# Patient Record
Sex: Female | Born: 2000 | Race: Black or African American | Hispanic: No | Marital: Single | Smoking: Never smoker
Health system: Southern US, Community
[De-identification: ages and names within clinical notes are randomized; demographics above are authoritative.]

## PROBLEM LIST (undated history)

## (undated) DIAGNOSIS — D573 Sickle-cell trait: Secondary | ICD-10-CM

## (undated) HISTORY — PX: WISDOM TOOTH EXTRACTION: SHX21

---
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
## 2019-01-24 ENCOUNTER — Other Ambulatory Visit: Payer: Self-pay

## 2019-01-24 ENCOUNTER — Ambulatory Visit (INDEPENDENT_AMBULATORY_CARE_PROVIDER_SITE_OTHER): Payer: Medicaid Other | Admitting: Certified Nurse Midwife

## 2019-01-24 ENCOUNTER — Encounter: Payer: Self-pay | Admitting: Certified Nurse Midwife

## 2019-01-24 ENCOUNTER — Other Ambulatory Visit (HOSPITAL_COMMUNITY)
Admission: RE | Admit: 2019-01-24 | Discharge: 2019-01-24 | Disposition: A | Discharge status: Home / Self Care | Payer: Medicaid Other | Source: Ambulatory Visit | Attending: Certified Nurse Midwife | Admitting: Certified Nurse Midwife

## 2019-01-24 VITALS — BP 118/72 | HR 102 | Wt 132.6 lb

## 2019-01-24 DIAGNOSIS — Z975 Presence of (intrauterine) contraceptive device: Secondary | ICD-10-CM | POA: Diagnosis not present

## 2019-01-24 DIAGNOSIS — N898 Other specified noninflammatory disorders of vagina: Secondary | ICD-10-CM | POA: Insufficient documentation

## 2019-01-24 NOTE — Patient Instructions (Signed)
Alternative Vaginitis Therapies  1) soak in tub of warm water waist high with 1/2 cup of baking soda in water for ~ 20 mins. 2) soak 3 tampons in 1 tablespoon of fractionated (liquid form) coconut oil with 10 drops of Melaleuca (Tea Tree) essential oil, insert 1 saturated tampon vaginally at bedtime x 3 days. Both options are to be done after sexual intercourse, menses and when suspects BV and/or yeast infection. Advised that these alternatives will not replace the need to be evaluated, if sx's persist. You will need to seek care at an OB/GYN provider.  GO WHITE: Soap: UNSCENTED Dove (white box light green writing) Laundry detergent (underwear)- Dreft or [employer] unscented WHITE 100% cotton panties (NOT just cotton crouch) Sanitary napkin/panty liners: UNSCENTED.  If it doesn't SAY unscented it can have a scent/perfume    NO PERFUMES OR LOTIONS OR POTIONS in the vulvar area (may use regular [place]) Condoms: hypoallergenic only. Non dyed (no color) Toilet papers: white only Wash clothes: use a separate wash cloth. WHITE.  Wash in Dreft.   You can purchase Tea Tree Oil locally at:  [employer] [address] [place], [place] [address] [phone]  [employer] [address] [place], [place] [address] [phone]  [employer] [address] [place], [place] [address] [phone] 

## 2019-01-24 NOTE — Progress Notes (Signed)
Pt reports vaginal discharge for about a week, pt also reports vaginal odor and itching.

## 2019-01-24 NOTE — Progress Notes (Signed)
History:  [name] is a 18 y.o. G0P0000 who presents to clinic today for vaginal discharge. She reports vaginal discharge started occurring one week ago, describes the discharge as clear thin discharge with odor. She reports occasional itching and irritation. Has unprotected IC with 1 partner, denies changes in partner since last seen. Currently has an IUD in place for birth control.    The following portions of the patient's history were reviewed and updated as appropriate: allergies, current medications, family history, past medical history, social history, past surgical history and problem list.  Review of Systems:  Review of Systems  Constitutional: Negative.   Respiratory: Negative.   Cardiovascular: Negative.   Gastrointestinal: Negative.   Genitourinary:       Vaginal discharge and odor   Neurological: Negative.      Objective:  Physical Exam BP 118/72   Pulse (!) 102   Wt 132 lb 9.6 oz (60.1 kg)   LMP 12/22/2018 (Approximate)   BMI 22.76 kg/m  Physical Exam Vitals signs and nursing note reviewed.  HENT:     Head: Normocephalic.  Cardiovascular:     Rate and Rhythm: Normal rate and regular rhythm.  Pulmonary:     Effort: Pulmonary effort is normal. No respiratory distress.     Breath sounds: Normal breath sounds.  Abdominal:     General: There is no distension.     Palpations: Abdomen is soft.     Tenderness: There is no abdominal tenderness.  Genitourinary:    Comments: Vaginal swabs obtained IUD strings palpated with bimanual examination  Bimanual exam: Cervix 0/long/high, firm, anterior, neg CMT, uterus nontender, nonenlarged, adnexa without tenderness, enlargement, or mass. IUD in place Skin:    General: Skin is warm and dry.  Neurological:     Mental Status: She is alert and oriented to person, place, and time.  Psychiatric:        Mood and Affect: Mood normal.        Behavior: Behavior normal.        Thought Content: Thought content  normal.     Assessment & Plan:  1. Vaginal discharge - Patient reports using friend's Dr Nancee Liter peppermint soap last week, since has noticed discharge and odor - Blind vaginal swabs obtained  - Educated and discussed vaginitis and use of unscented soap, laundry detergent, wash cloths and underwear use with not wearing thongs daily- patient verbalizes understanding  - Cervicovaginal ancillary only( [place])  2. IUD (intrauterine device) in place - Patient was evaluated in April 2020 for abnormal bleeding with IUD, started on OCP for 3 months to normalize bleeding  - Patient denies problems or concerns with IUD- is happy with IUD and denies continued abnormal bleeding at this time    Will follow up for results of cervicovaginal ancillary swab and manage accordingly   Lajean Manes, CNM 01/24/2019 10:25 AM

## 2019-01-27 ENCOUNTER — Emergency Department (HOSPITAL_COMMUNITY)
Admission: EM | Admit: 2019-01-27 | Discharge: 2019-01-27 | Disposition: A | Discharge status: Home / Self Care | Payer: Medicaid Other | Attending: Emergency Medicine | Admitting: Emergency Medicine

## 2019-01-27 ENCOUNTER — Encounter (HOSPITAL_COMMUNITY): Payer: Self-pay

## 2019-01-27 ENCOUNTER — Other Ambulatory Visit: Payer: Self-pay

## 2019-01-27 ENCOUNTER — Emergency Department (HOSPITAL_COMMUNITY): Payer: Medicaid Other

## 2019-01-27 DIAGNOSIS — R1110 Vomiting, unspecified: Secondary | ICD-10-CM | POA: Diagnosis not present

## 2019-01-27 DIAGNOSIS — R1011 Right upper quadrant pain: Secondary | ICD-10-CM | POA: Diagnosis not present

## 2019-01-27 LAB — URINALYSIS, ROUTINE W REFLEX MICROSCOPIC
Bacteria, UA: NONE SEEN
Bilirubin Urine: NEGATIVE
Glucose, UA: NEGATIVE mg/dL
Ketones, ur: 5 mg/dL — AB
Nitrite: NEGATIVE
Protein, ur: NEGATIVE mg/dL
Specific Gravity, Urine: >1.046 — ABNORMAL HIGH (ref 1.005–1.030)
pH: 6 (ref 5.0–8.0)

## 2019-01-27 LAB — CBC
HCT: 39 % (ref 36.0–46.0)
Hemoglobin: 13.5 g/dL (ref 12.0–15.0)
MCH: 30.4 pg (ref 26.0–34.0)
MCHC: 34.6 g/dL (ref 30.0–36.0)
MCV: 87.8 fL (ref 80.0–100.0)
Platelets: 210 10*3/uL (ref 150–400)
RBC: 4.44 MIL/uL (ref 3.87–5.11)
RDW: 12 % (ref 11.5–15.5)
WBC: 10.6 10*3/uL — ABNORMAL HIGH (ref 4.0–10.5)
nRBC: 0 % (ref 0.0–0.2)

## 2019-01-27 LAB — COMPREHENSIVE METABOLIC PANEL
ALT: 10 U/L (ref 0–44)
AST: 15 U/L (ref 15–41)
Albumin: 4.8 g/dL (ref 3.5–5.0)
Alkaline Phosphatase: 66 U/L (ref 38–126)
Anion gap: 9 (ref 5–15)
BUN: 14 mg/dL (ref 6–20)
CO2: 27 mmol/L (ref 22–32)
Calcium: 9.9 mg/dL (ref 8.9–10.3)
Chloride: 102 mmol/L (ref 98–111)
Creatinine, Ser: 0.99 mg/dL (ref 0.44–1.00)
GFR calc Af Amer: >60 mL/min (ref >60)
GFR calc non Af Amer: >60 mL/min (ref >60)
Glucose, Bld: 116 mg/dL — ABNORMAL HIGH (ref 70–99)
Potassium: 4 mmol/L (ref 3.5–5.1)
Sodium: 138 mmol/L (ref 135–145)
Total Bilirubin: 0.9 mg/dL (ref 0.3–1.2)
Total Protein: 8.8 g/dL — ABNORMAL HIGH (ref 6.5–8.1)

## 2019-01-27 LAB — I-STAT BETA HCG BLOOD, ED (MC, WL, AP ONLY): I-stat hCG, quantitative: <5 m[IU]/mL (ref <5)

## 2019-01-27 LAB — LIPASE, BLOOD: Lipase: 19 U/L (ref 11–51)

## 2019-01-27 MED ORDER — IOHEXOL 300 MG/ML  SOLN
100.0000 mL | Freq: Once | INTRAMUSCULAR | Status: AC | PRN
  Administered 2019-01-27: 100 mL via INTRAVENOUS

## 2019-01-27 MED ORDER — SODIUM CHLORIDE 0.9% FLUSH: 3.0000 mL | Freq: Once | INTRAVENOUS | Status: DC

## 2019-01-27 MED ORDER — SODIUM CHLORIDE (PF) 0.9 % IJ SOLN
INTRAMUSCULAR | Status: AC
  Filled 2019-01-27: qty 50

## 2019-01-27 MED ORDER — KETOROLAC TROMETHAMINE 30 MG/ML IJ SOLN
15.0000 mg | Freq: Once | INTRAMUSCULAR | Status: AC
  Administered 2019-01-27: 12:00:00 15 mg via INTRAVENOUS
  Filled 2019-01-27: qty 1

## 2019-01-27 NOTE — ED Provider Notes (Signed)
[facility] DEPT Provider Note   CSN: [id number] Arrival date & time: 01/27/19  1125     History   Chief Complaint Chief Complaint  Patient presents with  . Abdominal Pain    HPI Rachael Cook is a 18 y.o. female.     HPI Patient presents with abdominal pain, vomiting.  Onset was yesterday. Patient states that she is generally well has no history of abdominal surgery. Without clear precipitant, since yesterday patient has had pain focally in the right upper quadrant, just in the subcostal region, nonradiating, sharp, severe, worse with motion, palpation, and well in a motor vehicle, hitting bumps. There is associated severe pain, that is worse with all of the above. There is some nausea, though this is minimal. She has associated mild cough, occasionally. Last menstrual period was 3 weeks ago, unremarkable.  Since onset no other clearly beating or exacerbating factors. Patient Active Problem List   Diagnosis Date Noted  . IUD (intrauterine device) in place 09/25/2018  . Abnormal uterine bleeding (AUB) 09/25/2018  . Chlamydia infection 04/09/2018    History reviewed. No pertinent surgical history.   OB History    Gravida  0   Para  0   Term  0   Preterm  0   AB  0   Living  0     SAB  0   TAB  0   Ectopic  0   Multiple  0   Live Births  0            Home Medications    Prior to Admission medications   Medication Sig Start Date End Date Taking? Authorizing Provider  Levonorgestrel (KYLEENA IU) 1 Device by Intrauterine route.  05/10/18  Yes [provider]    Family History Family History  Problem Relation Age of Onset  . Cancer - Colon Maternal Grandmother     Social History Social History   Tobacco Use  . Smoking status: Never Smoker  . Smokeless tobacco: Never Used  Substance Use Topics  . Alcohol use: No  . Drug use: No     Allergies   Patient has no known allergies.    Review of Systems Review of Systems  Constitutional:       Per HPI, otherwise negative  HENT:       Per HPI, otherwise negative  Respiratory:       Per HPI, otherwise negative  Cardiovascular:       Per HPI, otherwise negative  Gastrointestinal: Positive for abdominal pain, nausea and vomiting.  Endocrine:       Negative aside from HPI  Genitourinary:       Neg aside from HPI   Musculoskeletal:       Per HPI, otherwise negative  Skin: Negative.   Neurological: Negative for syncope.     Physical Exam Updated Vital Signs BP 110/80   Pulse 80   Temp 98.8 F (37.1 C) (Oral)   Resp 17   Ht 5\' 1"  (1.549 m)   Wt 59.9 kg   SpO2 98%   BMI 24.94 kg/m   Physical Exam Vitals signs and nursing note reviewed.  Constitutional:      General: She is not in acute distress.    Appearance: She is well-developed.  HENT:     Head: Normocephalic and atraumatic.  Eyes:     Conjunctiva/sclera: Conjunctivae normal.  Cardiovascular:     Rate and Rhythm: Normal rate and regular rhythm.  Pulmonary:  Effort: Pulmonary effort is normal. No respiratory distress.     Breath sounds: Normal breath sounds. No stridor.  Abdominal:     General: There is no distension.     Tenderness: There is abdominal tenderness. There is guarding. Positive signs include Murphy's sign.  Skin:    General: Skin is warm and dry.  Neurological:     Mental Status: She is alert and oriented to person, place, and time.     Cranial Nerves: No cranial nerve deficit.      ED Treatments / Results  Labs (all labs ordered are listed, but only abnormal results are displayed) Labs Reviewed  COMPREHENSIVE METABOLIC PANEL - Abnormal; Notable for the following components:      Result Value   Glucose, Bld 116 (*)    Total Protein 8.8 (*)    All other components within normal limits  CBC - Abnormal; Notable for the following components:   WBC 10.6 (*)    All other components within normal limits  URINALYSIS,  ROUTINE W REFLEX MICROSCOPIC - Abnormal; Notable for the following components:   Specific Gravity, Urine >1.046 (*)    Hgb urine dipstick SMALL (*)    Ketones, ur 5 (*)    Leukocytes,Ua SMALL (*)    All other components within normal limits  LIPASE, BLOOD  I-STAT BETA HCG BLOOD, ED ([facility], [facility], [facility] ONLY)    EKG None  Radiology [place] Abdomen Complete  Result Date: 01/27/2019 CLINICAL DATA:  18 year old female with right upper quadrant pain, nausea and vomiting EXAM: ABDOMEN ULTRASOUND COMPLETE COMPARISON:  None. FINDINGS: Gallbladder: No gallstones or wall thickening visualized. No sonographic Murphy sign noted by sonographer. Common bile duct: Diameter: Normal at 2 mm. Liver: No focal lesion identified. Within normal limits in parenchymal echogenicity. Portal vein is patent on color Doppler imaging with normal direction of blood flow towards the liver. IVC: No abnormality visualized. Pancreas: Visualized portion unremarkable. Spleen: Size and appearance within normal limits. Right Kidney: Length: 10.6 cm. Echogenicity within normal limits. No mass or hydronephrosis visualized. Left Kidney: Length: 9.3 cm. Echogenicity within normal limits. No mass or hydronephrosis visualized. Abdominal aorta: No aneurysm visualized. Other findings: None. IMPRESSION: Normal exam. Electronically Signed   By: Malachy MoanHeath  McCullough M.D.   On: 01/27/2019 13:07   Ct Abdomen Pelvis W Contrast  Result Date: 01/27/2019 CLINICAL DATA:  Right sided abdominal pain, vomiting EXAM: CT ABDOMEN AND PELVIS WITH CONTRAST TECHNIQUE: Multidetector CT imaging of the abdomen and pelvis was performed using the standard protocol following bolus administration of intravenous contrast. CONTRAST:  100mL OMNIPAQUE IOHEXOL 300 MG/ML  SOLN COMPARISON:  None. FINDINGS: Lower chest: No acute abnormality. Hepatobiliary: No solid liver abnormality is seen. No gallstones, gallbladder wall thickening, or biliary dilatation. Pancreas: Unremarkable. No  pancreatic ductal dilatation or surrounding inflammatory changes. Spleen: Normal in size without significant abnormality. Adrenals/Urinary Tract: Adrenal glands are unremarkable. Kidneys are normal, without renal calculi, solid lesion, or hydronephrosis. There is note of early calyceal contrast excretion, which does not reflect renal calculi. Bladder is unremarkable. Stomach/Bowel: Stomach is within normal limits. Appendix is normal in appearance, located in a retrocecal position. No evidence of bowel wall thickening, distention, or inflammatory changes. Vascular/Lymphatic: No significant vascular findings are present. No enlarged abdominal or pelvic lymph nodes. Reproductive: Fluid attenuation cyst or follicle of the left ovary. IUD is present in the endometrial cavity. Other: No abdominal wall hernia or abnormality. No abdominopelvic ascites. Musculoskeletal: No acute or significant osseous findings. IMPRESSION: 1. No CT findings  of the abdomen or pelvis to explain right-sided abdominal pain or vomiting. 2.  IUD in the endometrial cavity. Electronically Signed   By: Lauralyn Primes M.D.   On: 01/27/2019 14:36    Procedures Procedures (including critical care time)  Medications Ordered in ED Medications  sodium chloride (PF) 0.9 % injection (has no administration in time range)  ketorolac (TORADOL) 30 MG/ML injection 15 mg (15 mg Intravenous Given 01/27/19 1228)  iohexol (OMNIPAQUE) 300 MG/ML solution 100 mL (100 mLs Intravenous Contrast Given 01/27/19 1352)     Initial Impression / Assessment and Plan / ED Course  I have reviewed the triage vital signs and the nursing notes.  Pertinent labs & imaging results that were available during my care of the patient were reviewed by me and considered in my medical decision making (see chart for details).    On repeat exam the patient continues to complain of pain in the right upper quadrant. Initial ultrasound is reassuring, giving consideration of  retrocecal appendix, CT scan planned  Now, on repeat exam the patient is much more comfortable appearance, resting in her hospital gurney with a female companion.    5:33 PM CT scan results reviewed with patient, who remains calm, hemodynamically unremarkable, in no distress. Patient has no respiratory complaints, and atypical pneumonia is less likely.  Some suspicion for passed kidney stone given mild leukocytosis, hematuria, knowing the patient's menstrual cycle was 3 weeks ago. With no acute abdomen, no substantial abnormalities on CT, ultrasound, and no lower abdominal pain, there is little suspicion for PID, torsion either. Given the patient's absence of distress, unremarkable vital signs, she would follow-up with primary care, or here as needed.  Final Clinical Impressions(s) / ED Diagnoses   Right upper quadrant abdominal pain   Gerhard Munch, MD 01/27/19 1736

## 2019-01-27 NOTE — ED Triage Notes (Signed)
States right quad abdominal pain since last pm with vomiting today states painful to sit up no fever voiced.

## 2019-01-27 NOTE — Discharge Instructions (Addendum)
As discussed, your evaluation today has been largely reassuring.  But, it is important that you monitor your condition carefully, and do not hesitate to return to the ED if you develop new, or concerning changes in your condition.  For the next 3 days please use ibuprofen, 200 mg, 3 times daily for pain control.  Otherwise, please follow-up with your physician for appropriate ongoing care.

## 2019-01-27 NOTE — ED Notes (Signed)
An After Visit Summary was printed and given to the patient. Discharge instructions given and no further questions at this time.  

## 2019-01-31 LAB — CERVICOVAGINAL ANCILLARY ONLY
Bacterial vaginitis: POSITIVE — AB
Candida vaginitis: NEGATIVE
Chlamydia: NEGATIVE
Neisseria Gonorrhea: POSITIVE — AB
Trichomonas: NEGATIVE

## 2019-02-01 ENCOUNTER — Other Ambulatory Visit: Payer: Self-pay | Admitting: Certified Nurse Midwife

## 2019-02-01 DIAGNOSIS — B9689 Other specified bacterial agents as the cause of diseases classified elsewhere: Secondary | ICD-10-CM

## 2019-02-01 DIAGNOSIS — A549 Gonococcal infection, unspecified: Secondary | ICD-10-CM | POA: Insufficient documentation

## 2019-02-01 HISTORY — DX: Gonococcal infection, unspecified: A54.9

## 2019-02-01 MED ORDER — TINIDAZOLE 500 MG PO TABS: 2.0000 g | ORAL_TABLET | Freq: Every day | ORAL | 0 refills | Status: DC

## 2019-02-02 ENCOUNTER — Other Ambulatory Visit: Payer: Self-pay

## 2019-02-02 ENCOUNTER — Ambulatory Visit (INDEPENDENT_AMBULATORY_CARE_PROVIDER_SITE_OTHER): Payer: Medicaid Other | Admitting: *Deleted

## 2019-02-02 VITALS — BP 112/68 | HR 84 | Ht 61.0 in | Wt 130.0 lb

## 2019-02-02 DIAGNOSIS — A549 Gonococcal infection, unspecified: Secondary | ICD-10-CM | POA: Diagnosis not present

## 2019-02-02 MED ORDER — CEFTRIAXONE SODIUM 250 MG IJ SOLR
250.0000 mg | Freq: Once | INTRAMUSCULAR | Status: AC
  Administered 2019-02-02: 250 mg via INTRAMUSCULAR

## 2019-02-02 NOTE — Patient Instructions (Signed)
Gonorrhea Gonorrhea is a sexually transmitted disease (STD) that can affect both men and women. If left untreated, this infection can:  Damage the female or female organs.  Cause women and men to be unable to have children (be sterile).  Harm a fetus if an infected woman is pregnant. It is important to get treatment for gonorrhea as soon as possible. It is also necessary for all of your sexual partners to be tested for the infection. What are the causes? This condition is caused by bacteria called Neisseria gonorrhoeae. The infection is spread from person to person through sexual contact, including oral, anal, and vaginal sex. A newborn can contract the infection from his or her mother during birth. What increases the risk? The following factors may make you more likely to develop this condition:  Being a woman who is younger than 19 years of age and who is sexually active.  Being a woman 25 years of age or older who has: ? A new sex partner. ? More than one sex partner. ? A sex partner who has an STD.  Being a man who has: ? A new sex partner. ? More than one sex partner. ? A sex partner who has an STD.  Using condoms inconsistently.  Currently having, or having previously had, an STD.  Exchanging sex for money or drugs. What are the signs or symptoms? Some people do not have any symptoms. If you do have symptoms, they may be different for females and males. For females  Pain in the lower abdomen.  Abnormal vaginal discharge. The discharge may be cloudy, thick, or yellow-green in color.  Bleeding between periods.  Painful sex.  Burning or itching in and around the vagina.  Pain or burning when urinating.  Irritation, pain, bleeding, or discharge from the rectum. This may occur if the infection was spread by anal sex.  Sore throat or swollen lymph nodes in the neck. This may occur if the infection was spread by oral sex. For males  Abnormal discharge from the penis.  This discharge may be cloudy, thick, or yellow-green in color.  Pain or burning during urination.  Pain or swelling in the testicles.  Irritation, pain, bleeding, or discharge from the rectum. This may occur if the infection was spread by anal sex.  Sore throat, fever, or swollen lymph nodes in the neck. This may occur if the infection was spread by oral sex. How is this diagnosed? This condition is diagnosed based on:  A physical exam.  A sample of discharge that is examined under a microscope to look for the bacteria. The discharge may be taken from the urethra, cervix, throat, or rectum.  Urine tests. Not all of test results will be available during your visit. How is this treated? This condition is treated with antibiotic medicines. It is important for treatment to begin as soon as possible. Early treatment may prevent some problems from developing. Do not have sex during treatment. Avoid all types of sexual activity for 7 days after treatment is complete and until any sex partners have been treated. Follow these instructions at home:  Take over-the-counter and prescription medicines only as told by your health care provider.  Take your antibiotic medicine as told by your health care provider. Do not stop taking the antibiotic even if you start to feel better.  Do not have sex until at least 7 days after you and your partner(s) have finished treatment and your health care provider says it is okay.    It is your responsibility to get your test results. Ask your health care provider, or the department performing the test, when your results will be ready.  If you test positive for gonorrhea, inform your recent sexual partners. This includes any oral, anal, or vaginal sex partners. They need to be checked for gonorrhea even if they do not have symptoms. They may need treatment, even if they test negative for gonorrhea.  Keep all follow-up visits as told by your health care provider.  This is important. How is this prevented?   Use latex condoms correctly every time you have sexual intercourse.  Ask if your sexual partner has been tested for STDs and had negative results.  Avoid having multiple sexual partners. Contact a health care provider if:  You develop a bad reaction to the medicine you were prescribed. This may include: ? A rash. ? Nausea. ? Vomiting. ? Diarrhea.  Your symptoms do not get better after a few days of taking antibiotics.  Your symptoms get worse.  You develop new symptoms.  Your pain gets worse.  You have a fever.  You develop pain, itching, or discharge around the eyes. Get help right away if:  You feel dizzy or faint.  You have trouble breathing or have shortness of breath.  You develop an irregular heartbeat.  You have severe abdominal pain with or without shoulder pain.  You develop any bumps or sores (lesions) on your skin.  You develop warmth, redness, pain, or swelling around your joints, such as the knee. Summary  Gonorrhea is an STD that can affect both men and women.  This condition is caused by bacteria called Neisseria gonorrhoeae. The infection is spread from person to person, usually through sexual contact, including oral, anal, and vaginal sex.  Symptoms vary between males and females. Generally, they include abnormal discharge and burning during urination. Women may also experience painful sex, itching around the vagina, and bleeding between menstrual periods. Men may also experience swelling of the testicles.  This condition is treated with antibiotic medicines. Do not have sex until at least 7 days after completing antibiotic treatment.  If left untreated, gonorrhea can have serious side effects and complications. This information is not intended to replace advice given to you by your health care provider. Make sure you discuss any questions you have with your health care provider. Document Released:  05/14/2000 Document Revised: 06/23/2018 Document Reviewed: 04/16/2016 Elsevier Patient Education  [place]. Ceftriaxone injection What is this medicine? CEFTRIAXONE (sef try AX one) is a cephalosporin antibiotic. It is used to treat certain kinds of bacterial infections. It will not work for colds, flu, or other viral infections. This medicine may be used for other purposes; ask your health care provider or pharmacist if you have questions. COMMON BRAND NAME(S): Ceftrisol Plus, Rocephin What should I tell my health care provider before I take this medicine? They need to know if you have any of these conditions:  any chronic illness  bowel disease, like colitis  both kidney and liver disease  high bilirubin level in newborn patients  an unusual or allergic reaction to ceftriaxone, other cephalosporin or penicillin antibiotics, foods, dyes, or preservatives  pregnant or trying to get pregnant  breast-feeding How should I use this medicine? This medicine is injected into a muscle or infused it into a vein. It is usually given in a medical office or clinic. If you are to give this medicine you will be taught how to inject it. Follow  instructions carefully. Use your doses at regular intervals. Do not take your medicine more often than directed. Do not skip doses or stop your medicine early even if you feel better. Do not stop taking except on your doctor's advice. Talk to your pediatrician regarding the use of this medicine in children. Special care may be needed. Overdosage: If you think you have taken too much of this medicine contact a poison control center or emergency room at once. NOTE: This medicine is only for you. Do not share this medicine with others. What if I miss a dose? If you miss a dose, take it as soon as you can. If it is almost time for your next dose, take only that dose. Do not take double or extra doses. What may interact with this medicine? Do not take this  medicine with any of the following medications:  intravenous calcium This medicine may also interact with the following medications:  birth control pills This list may not describe all possible interactions. Give your health care provider a list of all the medicines, herbs, non-prescription drugs, or dietary supplements you use. Also tell them if you smoke, drink alcohol, or use illegal drugs. Some items may interact with your medicine. What should I watch for while using this medicine? Tell your doctor or health care provider if your symptoms do not improve or if they get worse. This medicine may cause serious skin reactions. They can happen weeks to months after starting the medicine. Contact your health care provider right away if you notice fevers or flu-like symptoms with a rash. The rash may be red or purple and then turn into blisters or peeling of the skin. Or, you might notice a red rash with swelling of the face, lips or lymph nodes in your neck or under your arms. Do not treat diarrhea with over the counter products. Contact your doctor if you have diarrhea that lasts more than 2 days or if it is severe and watery. If you are being treated for a sexually transmitted disease, avoid sexual contact until you have finished your treatment. Having sex can infect your sexual partner. Calcium may bind to this medicine and cause lung or kidney problems. Avoid calcium products while taking this medicine and for 48 hours after taking the last dose of this medicine. What side effects may I notice from receiving this medicine? Side effects that you should report to your doctor or health care professional as soon as possible:  allergic reactions like skin rash, itching or hives, swelling of the face, lips, or tongue  breathing problems  fever, chills  irregular heartbeat  pain when passing urine  redness, blistering, peeling, or loosening of the skin, including inside the mouth  seizures   stomach pain, cramps  unusual bleeding, bruising  unusually weak or tired Side effects that usually do not require medical attention (report to your doctor or health care professional if they continue or are bothersome):  diarrhea  dizzy, drowsy  headache  nausea, vomiting  pain, swelling, irritation where injected  stomach upset  sweating This list may not describe all possible side effects. Call your doctor for medical advice about side effects. You may report side effects to FDA at 1-800-FDA-1088. Where should I keep my medicine? Keep out of the reach of children. Store at room temperature below 25 degrees C (77 degrees F). Protect from light. Throw away any unused vials after the expiration date. NOTE: This sheet is a summary. It may not  cover all possible information. If you have questions about this medicine, talk to your doctor, pharmacist, or health care provider.  2020 Elsevier/Gold Standard (2018-08-18 10:10:06)

## 2019-02-02 NOTE — Progress Notes (Signed)
     S: Patient in nurse clinic today for STD treatment of Gonorrhea.    O: Need for treatment of Gonorrhea.  A: Azithromycin 1 GM PO x 1 given and Ceftriaxone 250 mg IM x 1 given in RUOQ per  Sharlett Iles, CNM's orders.    Patient observed 15 minutes in office.  No reaction noted.   P: Patient to follow up in 6 weeks for re-screening.    STD report form fax completed and faxed to [facility] Department at [phone] (STD department).     Patient advised to abstain from sex for 7-10 days after treatment or when partner has been tested/treated.    Derl Barrow, RN

## 2019-02-27 ENCOUNTER — Telehealth: Payer: Self-pay | Admitting: Licensed Clinical Social Worker

## 2019-02-27 NOTE — Telephone Encounter (Signed)
Education given regarding options for contraception, including sex safe practices to reduce or eliminate STD transmission. Patient will continue use of IUD.

## 2019-03-16 ENCOUNTER — Ambulatory Visit: Payer: Medicaid Other | Admitting: Nurse Practitioner

## 2019-03-29 ENCOUNTER — Ambulatory Visit: Payer: Medicaid Other | Admitting: Advanced Practice Midwife

## 2019-04-05 ENCOUNTER — Ambulatory Visit: Payer: Medicaid Other

## 2019-04-18 ENCOUNTER — Other Ambulatory Visit: Payer: Self-pay

## 2019-04-18 DIAGNOSIS — Z20822 Contact with and (suspected) exposure to covid-19: Secondary | ICD-10-CM

## 2019-04-18 DIAGNOSIS — Z20828 Contact with and (suspected) exposure to other viral communicable diseases: Secondary | ICD-10-CM | POA: Diagnosis not present

## 2019-04-20 LAB — NOVEL CORONAVIRUS, NAA: SARS-CoV-2, NAA: NOT DETECTED

## 2019-05-07 ENCOUNTER — Other Ambulatory Visit: Payer: Self-pay

## 2019-05-07 DIAGNOSIS — Z20822 Contact with and (suspected) exposure to covid-19: Secondary | ICD-10-CM

## 2019-05-07 DIAGNOSIS — Z20828 Contact with and (suspected) exposure to other viral communicable diseases: Secondary | ICD-10-CM | POA: Diagnosis not present

## 2019-05-08 LAB — NOVEL CORONAVIRUS, NAA: SARS-CoV-2, NAA: NOT DETECTED

## 2019-05-14 ENCOUNTER — Other Ambulatory Visit: Payer: Self-pay

## 2019-05-14 DIAGNOSIS — Z20828 Contact with and (suspected) exposure to other viral communicable diseases: Secondary | ICD-10-CM | POA: Diagnosis not present

## 2019-05-14 DIAGNOSIS — Z20822 Contact with and (suspected) exposure to covid-19: Secondary | ICD-10-CM

## 2019-05-16 LAB — NOVEL CORONAVIRUS, NAA: SARS-CoV-2, NAA: DETECTED — AB

## 2019-05-24 ENCOUNTER — Other Ambulatory Visit: Payer: Medicaid Other

## 2019-06-11 ENCOUNTER — Other Ambulatory Visit: Payer: Self-pay

## 2019-06-11 ENCOUNTER — Other Ambulatory Visit (HOSPITAL_COMMUNITY)
Admission: RE | Admit: 2019-06-11 | Discharge: 2019-06-11 | Disposition: A | Discharge status: Home / Self Care | Payer: Medicaid Other | Source: Ambulatory Visit | Attending: Advanced Practice Midwife | Admitting: Advanced Practice Midwife

## 2019-06-11 ENCOUNTER — Ambulatory Visit (INDEPENDENT_AMBULATORY_CARE_PROVIDER_SITE_OTHER): Payer: Medicaid Other | Admitting: Advanced Practice Midwife

## 2019-06-11 ENCOUNTER — Encounter: Payer: Self-pay | Admitting: Advanced Practice Midwife

## 2019-06-11 VITALS — BP 108/71 | HR 85 | Wt 143.0 lb

## 2019-06-11 DIAGNOSIS — Z975 Presence of (intrauterine) contraceptive device: Secondary | ICD-10-CM

## 2019-06-11 DIAGNOSIS — N921 Excessive and frequent menstruation with irregular cycle: Secondary | ICD-10-CM | POA: Insufficient documentation

## 2019-06-11 DIAGNOSIS — Z30431 Encounter for routine checking of intrauterine contraceptive device: Secondary | ICD-10-CM

## 2019-06-11 MED ORDER — NORGESTIMATE-ETH ESTRADIOL 0.25-35 MG-MCG PO TABS: 1.0000 | ORAL_TABLET | Freq: Every day | ORAL | 2 refills | Status: DC

## 2019-06-11 NOTE — Progress Notes (Signed)
RGYN pt presents for problem visit w/ IUD today.  Pt states she believes that her IUD is displaced pt denies any pain and notes spotting.   Pt states she has noticed this for the past 2 weeks now.     Pt states her partner states IUD is poking him during intercourse.   *P[employer]et Consents to student in exam, room.

## 2019-06-11 NOTE — Progress Notes (Signed)
History:  [name] is a 19 y.o. [id number] who presents to clinic today to talk about birth control options. She currently has a [place] IUD placed and is experiencing bleeding. She does not want a period and wants to discuss Depo-Provera because her friend doesn't have menstrual bleeding while on the shot. She doesn't experience any discomfort or pain with the IUD, but her partner does notice it. She is happy with her IUD if it is not misplaced, she would just like to correct some of the irregular bleeding. She has been put on oral contraceptive pills for breakthrough bleeding before and would like to do this again.   The following portions of the patient's history were reviewed and updated as appropriate: allergies, current medications, family history, past medical history, social history, past surgical history and problem list.  Review of Systems:  Review of Systems  Gastrointestinal: Negative for abdominal pain.  Genitourinary: Negative for dysuria.       Negative for discharge, abdominal cramping.       Objective:  Physical Exam BP 108/71   Pulse 85   Wt 64.9 kg   BMI 27.02 kg/m  Physical Exam  Constitutional: She is oriented to person, place, and time. She appears well-developed and well-nourished.  HENT:  Head: Normocephalic and atraumatic.  Eyes: Conjunctivae and EOM are normal.  Respiratory: Effort normal. No respiratory distress.  Genitourinary:    Vagina normal.     Genitourinary Comments: Cervix: Nulliparous and without lesions. Minimal discharge present. Gc/ct cervical swab collected. IUD strings present, no indication of the IUD being displaced.    Musculoskeletal:        General: Normal range of motion.     Cervical back: Normal range of motion.  Neurological: She is alert and oriented to person, place, and time.  Skin: Skin is warm and dry.  Psychiatric: She has a normal mood and affect. Her behavior is normal. Judgment and thought content normal.       Labs and Imaging No results found for this or any previous visit (from the past 24 hour(s)).  No results found.   Assessment & Plan:  1. Breakthrough bleeding associated with Rutha Bouchard IUD  Discussed different birth control options and their effectiveness. Patient wants to continue with her Rutha Bouchard IUD for contraception. - Noregestimate-ethinyl estradiol tablets   2. Asymptomatic STD screening  - Gc/ct cervical ancillary swab     Rachael Cook, [place]ighbor 06/11/2019 11:09 AM

## 2019-06-12 LAB — CERVICOVAGINAL ANCILLARY ONLY
Chlamydia: NEGATIVE
Comment: NEGATIVE
Comment: NORMAL
Neisseria Gonorrhea: NEGATIVE

## 2019-08-02 DIAGNOSIS — Z113 Encounter for screening for infections with a predominantly sexual mode of transmission: Secondary | ICD-10-CM | POA: Diagnosis not present

## 2019-08-02 DIAGNOSIS — Z30431 Encounter for routine checking of intrauterine contraceptive device: Secondary | ICD-10-CM | POA: Diagnosis not present

## 2019-08-02 DIAGNOSIS — Z30013 Encounter for initial prescription of injectable contraceptive: Secondary | ICD-10-CM | POA: Diagnosis not present

## 2019-08-20 ENCOUNTER — Other Ambulatory Visit: Payer: Medicaid Other

## 2019-09-10 DIAGNOSIS — Z30432 Encounter for removal of intrauterine contraceptive device: Secondary | ICD-10-CM | POA: Diagnosis not present

## 2019-10-02 ENCOUNTER — Other Ambulatory Visit: Payer: Medicaid Other

## 2019-10-02 DIAGNOSIS — Z20828 Contact with and (suspected) exposure to other viral communicable diseases: Secondary | ICD-10-CM | POA: Diagnosis not present

## 2019-10-03 ENCOUNTER — Other Ambulatory Visit: Payer: Medicaid Other

## 2019-11-13 DIAGNOSIS — Z309 Encounter for contraceptive management, unspecified: Secondary | ICD-10-CM | POA: Diagnosis not present

## 2019-11-13 DIAGNOSIS — Z3042 Encounter for surveillance of injectable contraceptive: Secondary | ICD-10-CM | POA: Diagnosis not present

## 2019-11-13 DIAGNOSIS — Z3200 Encounter for pregnancy test, result unknown: Secondary | ICD-10-CM | POA: Diagnosis not present

## 2019-12-05 DIAGNOSIS — Z3042 Encounter for surveillance of injectable contraceptive: Secondary | ICD-10-CM | POA: Diagnosis not present

## 2019-12-05 DIAGNOSIS — N938 Other specified abnormal uterine and vaginal bleeding: Secondary | ICD-10-CM | POA: Diagnosis not present

## 2019-12-26 ENCOUNTER — Other Ambulatory Visit: Payer: Medicaid Other

## 2020-01-22 ENCOUNTER — Other Ambulatory Visit: Payer: Self-pay

## 2020-01-22 ENCOUNTER — Emergency Department (HOSPITAL_BASED_OUTPATIENT_CLINIC_OR_DEPARTMENT_OTHER): Payer: Medicaid Other

## 2020-01-22 ENCOUNTER — Encounter (HOSPITAL_BASED_OUTPATIENT_CLINIC_OR_DEPARTMENT_OTHER): Payer: Self-pay

## 2020-01-22 ENCOUNTER — Emergency Department (HOSPITAL_BASED_OUTPATIENT_CLINIC_OR_DEPARTMENT_OTHER)
Admission: EM | Admit: 2020-01-22 | Discharge: 2020-01-22 | Disposition: A | Discharge status: Home / Self Care | Payer: Medicaid Other | Attending: Emergency Medicine | Admitting: Emergency Medicine

## 2020-01-22 DIAGNOSIS — Y999 Unspecified external cause status: Secondary | ICD-10-CM | POA: Diagnosis not present

## 2020-01-22 DIAGNOSIS — M25571 Pain in right ankle and joints of right foot: Secondary | ICD-10-CM | POA: Diagnosis not present

## 2020-01-22 DIAGNOSIS — Z041 Encounter for examination and observation following transport accident: Secondary | ICD-10-CM | POA: Diagnosis not present

## 2020-01-22 DIAGNOSIS — Y939 Activity, unspecified: Secondary | ICD-10-CM | POA: Insufficient documentation

## 2020-01-22 DIAGNOSIS — M7989 Other specified soft tissue disorders: Secondary | ICD-10-CM | POA: Diagnosis not present

## 2020-01-22 DIAGNOSIS — S8252XA Displaced fracture of medial malleolus of left tibia, initial encounter for closed fracture: Secondary | ICD-10-CM | POA: Diagnosis not present

## 2020-01-22 DIAGNOSIS — R52 Pain, unspecified: Secondary | ICD-10-CM | POA: Diagnosis not present

## 2020-01-22 DIAGNOSIS — S8251XA Displaced fracture of medial malleolus of right tibia, initial encounter for closed fracture: Secondary | ICD-10-CM | POA: Diagnosis not present

## 2020-01-22 DIAGNOSIS — R609 Edema, unspecified: Secondary | ICD-10-CM | POA: Diagnosis not present

## 2020-01-22 DIAGNOSIS — Y929 Unspecified place or not applicable: Secondary | ICD-10-CM | POA: Insufficient documentation

## 2020-01-22 MED ORDER — HYDROCODONE-ACETAMINOPHEN 5-325 MG PO TABS
1.0000 | ORAL_TABLET | Freq: Once | ORAL | Status: AC
  Administered 2020-01-22: 1 via ORAL
  Filled 2020-01-22: qty 1

## 2020-01-22 MED ORDER — IBUPROFEN 800 MG PO TABS
800.0000 mg | ORAL_TABLET | Freq: Once | ORAL | Status: AC
  Administered 2020-01-22: 800 mg via ORAL
  Filled 2020-01-22: qty 1

## 2020-01-22 MED ORDER — HYDROCODONE-ACETAMINOPHEN 5-325 MG PO TABS: 1.0000 | ORAL_TABLET | ORAL | 0 refills | Status: DC | PRN

## 2020-01-22 MED FILL — HYDROCODON-APAP 5-325: 5-325 | 2 days supply | Qty: 10 | Fill #0
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
## 2020-01-22 NOTE — ED Notes (Signed)
ED Provider at bedside. 
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
## 2020-01-22 NOTE — Discharge Instructions (Addendum)
Keep splint on, clean, dry. You can elevate the leg and apply ice for 20 minutes at least 3 times daily. Take Motrin and Tylenol as needed as directed for pain. Take Norco for pain not controlled with Motrin and Tylenol. Norco contains Tylenol, take in place of Tylenol.

## 2020-01-23 ENCOUNTER — Encounter: Payer: Self-pay | Admitting: Physician Assistant

## 2020-01-23 ENCOUNTER — Ambulatory Visit (INDEPENDENT_AMBULATORY_CARE_PROVIDER_SITE_OTHER): Payer: Medicaid Other | Admitting: Physician Assistant

## 2020-01-23 VITALS — Ht 64.0 in | Wt 130.0 lb

## 2020-01-23 DIAGNOSIS — S8251XA Displaced fracture of medial malleolus of right tibia, initial encounter for closed fracture: Secondary | ICD-10-CM | POA: Diagnosis not present

## 2020-01-23 MED ORDER — OXYCODONE-ACETAMINOPHEN 5-325 MG PO TABS: 1.0000 | ORAL_TABLET | ORAL | 0 refills | Status: DC | PRN
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
## 2020-01-28 ENCOUNTER — Other Ambulatory Visit (HOSPITAL_COMMUNITY)
Admission: RE | Admit: 2020-01-28 | Discharge: 2020-01-28 | Disposition: A | Discharge status: Home / Self Care | Payer: Medicaid Other | Source: Ambulatory Visit | Attending: Orthopedic Surgery | Admitting: Orthopedic Surgery

## 2020-01-28 ENCOUNTER — Other Ambulatory Visit: Payer: Self-pay | Admitting: Physician Assistant

## 2020-01-28 DIAGNOSIS — Z01812 Encounter for preprocedural laboratory examination: Secondary | ICD-10-CM | POA: Insufficient documentation

## 2020-01-28 DIAGNOSIS — Z20822 Contact with and (suspected) exposure to covid-19: Secondary | ICD-10-CM | POA: Diagnosis not present

## 2020-01-28 LAB — SARS CORONAVIRUS 2 (TAT 6-24 HRS): SARS Coronavirus 2: NEGATIVE

## 2020-01-29 ENCOUNTER — Other Ambulatory Visit: Payer: Self-pay

## 2020-01-29 ENCOUNTER — Encounter (HOSPITAL_COMMUNITY): Payer: Self-pay | Admitting: Orthopedic Surgery

## 2020-01-30 ENCOUNTER — Encounter (HOSPITAL_COMMUNITY): Payer: Self-pay | Admitting: Orthopedic Surgery

## 2020-01-30 ENCOUNTER — Encounter (HOSPITAL_COMMUNITY)
Admission: RE | Disposition: A | Discharge status: Home / Self Care | Payer: Self-pay | Source: Home / Self Care | Attending: Orthopedic Surgery

## 2020-01-30 ENCOUNTER — Ambulatory Visit (HOSPITAL_COMMUNITY): Payer: Medicaid Other | Admitting: Certified Registered Nurse Anesthetist

## 2020-01-30 ENCOUNTER — Ambulatory Visit (HOSPITAL_COMMUNITY)
Admission: RE | Admit: 2020-01-30 | Discharge: 2020-01-30 | Disposition: A | Discharge status: Home / Self Care | Payer: Medicaid Other | Attending: Orthopedic Surgery | Admitting: Orthopedic Surgery

## 2020-01-30 ENCOUNTER — Other Ambulatory Visit: Payer: Self-pay

## 2020-01-30 DIAGNOSIS — G8918 Other acute postprocedural pain: Secondary | ICD-10-CM | POA: Diagnosis not present

## 2020-01-30 DIAGNOSIS — D573 Sickle-cell trait: Secondary | ICD-10-CM | POA: Diagnosis not present

## 2020-01-30 DIAGNOSIS — N939 Abnormal uterine and vaginal bleeding, unspecified: Secondary | ICD-10-CM | POA: Diagnosis not present

## 2020-01-30 DIAGNOSIS — S8251XA Displaced fracture of medial malleolus of right tibia, initial encounter for closed fracture: Secondary | ICD-10-CM

## 2020-01-30 HISTORY — PX: ORIF ANKLE FRACTURE: SHX5408

## 2020-01-30 HISTORY — DX: Sickle-cell trait: D57.3

## 2020-01-30 SURGERY — OPEN REDUCTION INTERNAL FIXATION (ORIF) ANKLE FRACTURE
Anesthesia: General | Site: Ankle | Laterality: Right

## 2020-01-30 MED ORDER — MIDAZOLAM HCL 2 MG/2ML IJ SOLN
INTRAMUSCULAR | Status: DC | PRN
  Administered 2020-01-30: 2 mg via INTRAVENOUS

## 2020-01-30 MED ORDER — LIDOCAINE HCL (CARDIAC) PF 100 MG/5ML IV SOSY
PREFILLED_SYRINGE | INTRAVENOUS | Status: DC | PRN
  Administered 2020-01-30: 80 mg via INTRAVENOUS

## 2020-01-30 MED ORDER — ONDANSETRON HCL 4 MG/2ML IJ SOLN
INTRAMUSCULAR | Status: DC | PRN
  Administered 2020-01-30: 4 mg via INTRAVENOUS

## 2020-01-30 MED ORDER — EPHEDRINE 5 MG/ML INJ
INTRAVENOUS | Status: AC
  Filled 2020-01-30: qty 10

## 2020-01-30 MED ORDER — LIDOCAINE 2% (20 MG/ML) 5 ML SYRINGE
INTRAMUSCULAR | Status: AC
  Filled 2020-01-30: qty 5

## 2020-01-30 MED ORDER — BUPIVACAINE-EPINEPHRINE (PF) 0.5% -1:200000 IJ SOLN
INTRAMUSCULAR | Status: DC | PRN
  Administered 2020-01-30: 15 mL via PERINEURAL

## 2020-01-30 MED ORDER — ONDANSETRON HCL 4 MG/2ML IJ SOLN: 4.0000 mg | Freq: Once | INTRAMUSCULAR | Status: DC | PRN

## 2020-01-30 MED ORDER — ASCRIPTIN 325 MG PO TABS: 1.0000 | ORAL_TABLET | Freq: Every day | ORAL | 2 refills | Status: AC

## 2020-01-30 MED ORDER — CEFAZOLIN SODIUM-DEXTROSE 2-4 GM/100ML-% IV SOLN
INTRAVENOUS | Status: AC
  Filled 2020-01-30: qty 100

## 2020-01-30 MED ORDER — CEFAZOLIN SODIUM-DEXTROSE 2-4 GM/100ML-% IV SOLN
2.0000 g | INTRAVENOUS | Status: AC
  Administered 2020-01-30: 2 g via INTRAVENOUS

## 2020-01-30 MED ORDER — PROPOFOL 10 MG/ML IV BOLUS
INTRAVENOUS | Status: DC | PRN
  Administered 2020-01-30: 200 mg via INTRAVENOUS
  Administered 2020-01-30 (×2): 20 mg via INTRAVENOUS

## 2020-01-30 MED ORDER — ACETAMINOPHEN 325 MG PO TABS: 325.0000 mg | ORAL_TABLET | ORAL | Status: DC | PRN

## 2020-01-30 MED ORDER — FENTANYL CITRATE (PF) 100 MCG/2ML IJ SOLN
INTRAMUSCULAR | Status: AC
  Administered 2020-01-30: 100 ug via INTRAVENOUS
  Filled 2020-01-30: qty 2

## 2020-01-30 MED ORDER — CHLORHEXIDINE GLUCONATE 0.12 % MT SOLN: 15.0000 mL | Freq: Once | OROMUCOSAL | Status: AC

## 2020-01-30 MED ORDER — FENTANYL CITRATE (PF) 100 MCG/2ML IJ SOLN
25.0000 ug | INTRAMUSCULAR | Status: DC | PRN
  Administered 2020-01-30 (×2): 50 ug via INTRAVENOUS

## 2020-01-30 MED ORDER — ONDANSETRON HCL 4 MG/2ML IJ SOLN
INTRAMUSCULAR | Status: AC
  Filled 2020-01-30: qty 2

## 2020-01-30 MED ORDER — FENTANYL CITRATE (PF) 100 MCG/2ML IJ SOLN: 100.0000 ug | Freq: Once | INTRAMUSCULAR | Status: AC

## 2020-01-30 MED ORDER — ACETAMINOPHEN 160 MG/5ML PO SOLN: 325.0000 mg | ORAL | Status: DC | PRN

## 2020-01-30 MED ORDER — OXYCODONE HCL 5 MG/5ML PO SOLN: 5.0000 mg | Freq: Once | ORAL | Status: AC | PRN

## 2020-01-30 MED ORDER — FENTANYL CITRATE (PF) 100 MCG/2ML IJ SOLN
INTRAMUSCULAR | Status: AC
  Filled 2020-01-30: qty 2

## 2020-01-30 MED ORDER — ORAL CARE MOUTH RINSE: 15.0000 mL | Freq: Once | OROMUCOSAL | Status: AC

## 2020-01-30 MED ORDER — MIDAZOLAM HCL 2 MG/2ML IJ SOLN
INTRAMUSCULAR | Status: AC
  Administered 2020-01-30: 2 mg via INTRAVENOUS
  Filled 2020-01-30: qty 2

## 2020-01-30 MED ORDER — FENTANYL CITRATE (PF) 250 MCG/5ML IJ SOLN
INTRAMUSCULAR | Status: AC
  Filled 2020-01-30: qty 5

## 2020-01-30 MED ORDER — CHLORHEXIDINE GLUCONATE 0.12 % MT SOLN
OROMUCOSAL | Status: AC
  Administered 2020-01-30: 15 mL via OROMUCOSAL
  Filled 2020-01-30: qty 15

## 2020-01-30 MED ORDER — MEPERIDINE HCL 25 MG/ML IJ SOLN: 6.2500 mg | INTRAMUSCULAR | Status: DC | PRN

## 2020-01-30 MED ORDER — FENTANYL CITRATE (PF) 100 MCG/2ML IJ SOLN
INTRAMUSCULAR | Status: DC | PRN
Start: 2020-01-30 — End: 2020-01-30
  Administered 2020-01-30: 50 ug via INTRAVENOUS

## 2020-01-30 MED ORDER — MIDAZOLAM HCL 2 MG/2ML IJ SOLN: 2.0000 mg | Freq: Once | INTRAMUSCULAR | Status: AC

## 2020-01-30 MED ORDER — DEXAMETHASONE SODIUM PHOSPHATE 10 MG/ML IJ SOLN
INTRAMUSCULAR | Status: AC
  Filled 2020-01-30: qty 1

## 2020-01-30 MED ORDER — MIDAZOLAM HCL 2 MG/2ML IJ SOLN
INTRAMUSCULAR | Status: AC
  Filled 2020-01-30: qty 2

## 2020-01-30 MED ORDER — LACTATED RINGERS IV SOLN: INTRAVENOUS | Status: DC

## 2020-01-30 MED ORDER — BUPIVACAINE LIPOSOME 1.3 % IJ SUSP
INTRAMUSCULAR | Status: DC | PRN
  Administered 2020-01-30: 10 mL via PERINEURAL

## 2020-01-30 MED ORDER — OXYCODONE HCL 5 MG PO TABS
5.0000 mg | ORAL_TABLET | Freq: Once | ORAL | Status: AC | PRN
  Administered 2020-01-30: 5 mg via ORAL

## 2020-01-30 MED ORDER — DEXAMETHASONE SODIUM PHOSPHATE 10 MG/ML IJ SOLN
INTRAMUSCULAR | Status: DC | PRN
  Administered 2020-01-30: 8 mg via INTRAVENOUS

## 2020-01-30 MED ORDER — OXYCODONE HCL 5 MG PO TABS
ORAL_TABLET | ORAL | Status: DC
Start: 2020-01-30 — End: 2020-01-30
  Filled 2020-01-30: qty 1

## 2020-01-30 MED ORDER — 0.9 % SODIUM CHLORIDE (POUR BTL) OPTIME
TOPICAL | Status: DC | PRN
  Administered 2020-01-30: 1000 mL

## 2020-01-30 MED ORDER — OXYCODONE-ACETAMINOPHEN 5-325 MG PO TABS: 1.0000 | ORAL_TABLET | ORAL | 0 refills | Status: DC | PRN

## 2020-01-30 SURGICAL SUPPLY — 41 items
BANDAGE ESMARK 6X9 LF (GAUZE/BANDAGES/DRESSINGS) IMPLANT
BNDG COHESIVE 4X5 TAN STRL (GAUZE/BANDAGES/DRESSINGS) ×2 IMPLANT
BNDG ESMARK 6X9 LF (GAUZE/BANDAGES/DRESSINGS)
BNDG GAUZE ELAST 4 BULKY (GAUZE/BANDAGES/DRESSINGS) ×2 IMPLANT
COVER SURGICAL LIGHT HANDLE (MISCELLANEOUS) ×2 IMPLANT
COVER WAND RF STERILE (DRAPES) ×2 IMPLANT
DRAPE OEC MINIVIEW 54X84 (DRAPES) IMPLANT
DRAPE U-SHAPE 47X51 STRL (DRAPES) ×2 IMPLANT
DRSG ADAPTIC 3X8 NADH LF (GAUZE/BANDAGES/DRESSINGS) ×2 IMPLANT
DRSG PAD ABDOMINAL 8X10 ST (GAUZE/BANDAGES/DRESSINGS) ×2 IMPLANT
DRSG TUBE GAUZE 1X5YD SZ2 (GAUZE/BANDAGES/DRESSINGS) ×2 IMPLANT
DURAPREP 26ML APPLICATOR (WOUND CARE) ×2 IMPLANT
ELECT REM PT RETURN 9FT ADLT (ELECTROSURGICAL) ×2
ELECTRODE REM PT RTRN 9FT ADLT (ELECTROSURGICAL) ×1 IMPLANT
GAUZE PETROLATUM 1 X8 (GAUZE/BANDAGES/DRESSINGS) ×2 IMPLANT
GAUZE SPONGE 4X4 12PLY STRL (GAUZE/BANDAGES/DRESSINGS) ×2 IMPLANT
GLOVE BIOGEL PI IND STRL 9 (GLOVE) ×1 IMPLANT
GLOVE BIOGEL PI INDICATOR 9 (GLOVE) ×1
GLOVE SURG ORTHO 9.0 STRL STRW (GLOVE) ×2 IMPLANT
GOWN STRL REUS W/ TWL XL LVL3 (GOWN DISPOSABLE) ×3 IMPLANT
GOWN STRL REUS W/TWL XL LVL3 (GOWN DISPOSABLE) ×3
GUIDEWIRE PIN ORTH 6X1.6XSMTH (WIRE) ×2 IMPLANT
K-WIRE 1.6 (WIRE) ×2
KIT BASIN OR (CUSTOM PROCEDURE TRAY) ×2 IMPLANT
KIT TURNOVER KIT B (KITS) ×2 IMPLANT
MANIFOLD NEPTUNE II (INSTRUMENTS) ×2 IMPLANT
NS IRRIG 1000ML POUR BTL (IV SOLUTION) ×2 IMPLANT
PACK ORTHO EXTREMITY (CUSTOM PROCEDURE TRAY) ×2 IMPLANT
PAD ABD 7.5X8 STRL (GAUZE/BANDAGES/DRESSINGS) ×2 IMPLANT
PAD ARMBOARD 7.5X6 YLW CONV (MISCELLANEOUS) ×4 IMPLANT
SCREW CANN 1/3 THRD RVRS CT (Screw) ×2 IMPLANT
SCREW CANNULATED 4.0X40 (Screw) ×2 IMPLANT
STAPLER VISISTAT 35W (STAPLE) IMPLANT
SUCTION FRAZIER HANDLE 10FR (MISCELLANEOUS) ×1
SUCTION TUBE FRAZIER 10FR DISP (MISCELLANEOUS) ×1 IMPLANT
SUT ETHILON 2 0 PSLX (SUTURE) IMPLANT
SUT VIC AB 2-0 CT1 27 (SUTURE) ×1
SUT VIC AB 2-0 CT1 TAPERPNT 27 (SUTURE) ×1 IMPLANT
TOWEL GREEN STERILE (TOWEL DISPOSABLE) ×2 IMPLANT
TOWEL GREEN STERILE FF (TOWEL DISPOSABLE) ×2 IMPLANT
TUBE CONNECTING 12X1/4 (SUCTIONS) ×2 IMPLANT

## 2020-01-30 NOTE — Anesthesia Procedure Notes (Signed)
Anesthesia Regional Block: Adductor canal block   Pre-Anesthetic Checklist: ,, timeout performed, Correct Patient, Correct Site, Correct Laterality, Correct Procedure, Correct Position, site marked, Risks and benefits discussed,  Surgical consent,  Pre-op evaluation,  At surgeon's request and post-op pain management  Laterality: Right  Prep: chloraprep       Needles:  Injection technique: Single-shot  Needle Type: Echogenic Stimulator Needle     Needle Length: 5cm  Needle Gauge: 22     Additional Needles:   Procedures:, nerve stimulator,,, ultrasound used (permanent image in chart),,,,  Narrative:  Start time: 01/30/2020 10:44 AM End time: 01/30/2020 10:50 AM Injection made incrementally with aspirations every 5 mL.  Performed by: Personally  Anesthesiologist: Bethena Midget, MD  Additional Notes: Functioning IV was confirmed and monitors were applied.  A 59mm 22ga Arrow echogenic stimulator needle was used. Sterile prep and drape,hand hygiene and sterile gloves were used. Ultrasound guidance: relevant anatomy identified, needle position confirmed, local anesthetic spread visualized around nerve(s)., vascular puncture avoided.  Image printed for medical record. Negative aspiration and negative test dose prior to incremental administration of local anesthetic. The patient tolerated the procedure well.

## 2020-01-30 NOTE — Anesthesia Preprocedure Evaluation (Signed)
Anesthesia Evaluation  Patient identified by MRN, date of birth, ID band Patient awake    Reviewed: Allergy & Precautions, H&P , NPO status , Patient's Chart, lab work & pertinent test results, reviewed documented beta blocker date and time   Airway Mallampati: II  TM Distance: >3 FB Neck ROM: full    Dental no notable dental hx.    Pulmonary neg pulmonary ROS,    Pulmonary exam normal breath sounds clear to auscultation       Cardiovascular Exercise Tolerance: Good negative cardio ROS   Rhythm:regular Rate:Normal     Neuro/Psych negative neurological ROS  negative psych ROS   GI/Hepatic negative GI ROS, Neg liver ROS,   Endo/Other  negative endocrine ROS  Renal/GU negative Renal ROS  negative genitourinary   Musculoskeletal   Abdominal   Peds  Hematology negative hematology ROS (+)   Anesthesia Other Findings   Reproductive/Obstetrics negative OB ROS                             Anesthesia Physical Anesthesia Plan  ASA: I  Anesthesia Plan: General   Post-op Pain Management: GA combined w/ Regional for post-op pain   Induction:   PONV Risk Score and Plan: 3 and Ondansetron, Dexamethasone and Treatment may vary due to age or medical condition  Airway Management Planned: LMA and Oral ETT  Additional Equipment:   Intra-op Plan:   Post-operative Plan:   Informed Consent: I have reviewed the patients History and Physical, chart, labs and discussed the procedure including the risks, benefits and alternatives for the proposed anesthesia with the patient or authorized representative who has indicated his/her understanding and acceptance.     Dental Advisory Given  Plan Discussed with: CRNA and Anesthesiologist  Anesthesia Plan Comments:         Anesthesia Quick Evaluation
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
## 2020-01-30 NOTE — Op Note (Signed)
01/30/2020  12:11 PM  PATIENT:  Rachael Cook    PRE-OPERATIVE DIAGNOSIS:  Right Medial Malleolus Fracture  POST-OPERATIVE DIAGNOSIS:  Same  PROCEDURE:  OPEN REDUCTION INTERNAL FIXATION (ORIF) RIGHT ANKLE FRACTURE  SURGEON:  Nadara Mustard, MD  PHYSICIAN ASSISTANT:None ANESTHESIA:   General  PREOPERATIVE INDICATIONS:  [name] is a  19 y.o. female with a diagnosis of Right Medial Malleolus Fracture who failed conservative measures and elected for surgical management.    The risks benefits and alternatives were discussed with the patient preoperatively including but not limited to the risks of infection, bleeding, nerve injury, cardiopulmonary complications, the need for revision surgery, among others, and the patient was willing to proceed.  OPERATIVE IMPLANTS: 40 mm cannulated screws x2  @ENCIMAGES @  OPERATIVE FINDINGS: C-arm fluoroscopy verified reduction of the medial malleolus  OPERATIVE PROCEDURE: Patient was brought the operating room and underwent a general anesthetic.  After adequate levels anesthesia were obtained patient's right lower extremity was prepped using DuraPrep draped into a sterile field a timeout was called.  A incision was made over the medial malleolus this was carried down to the fracture site.  Subperiosteal dissection was used to freshen the fracture.  The fracture was open and the joint was irrigated with normal saline there was a large hematoma.  The fracture was reduced and stabilized with 2 K wires.  C-arm fluoroscopy verified a congruent mortise.  2 cannulated screws were placed over the wire C arm fluoroscopy again verified reduction of the mortise the wound was irrigated with normal saline the incision was closed using 2-0 nylon a sterile dressing was applied.  Patient was extubated taken the PACU in stable condition.   DISCHARGE PLANNING:  Antibiotic duration: Preoperative antibiotics  Weightbearing: Nonweightbearing on the  right  Pain medication: Prescription for Percocet  Dressing care/ Wound VAC: Follow-up in the office 1 week to change the dressing  Ambulatory devices: Crutches  Discharge to: Home.  Follow-up: In the office 1 week post operative.

## 2020-01-30 NOTE — Anesthesia Procedure Notes (Signed)
Procedure Name: LMA Insertion Date/Time: 01/30/2020 11:49 AM Performed by: Modena Morrow, CRNA Pre-anesthesia Checklist: Patient identified, Emergency Drugs available, Suction available and Patient being monitored Patient Re-evaluated:Patient Re-evaluated prior to induction Oxygen Delivery Method: Circle system utilized Preoxygenation: Pre-oxygenation with 100% oxygen Induction Type: IV induction Ventilation: Mask ventilation without difficulty LMA: LMA inserted LMA Size: 4.0 Number of attempts: 1 Placement Confirmation: positive ETCO2 and breath sounds checked- equal and bilateral Tube secured with: Tape Dental Injury: Teeth and Oropharynx as per pre-operative assessment  Comments: Placed by Freddie Apley

## 2020-01-30 NOTE — Transfer of Care (Signed)
Immediate Anesthesia Transfer of Care Note  Patient: Chiropractor  Procedure(s) Performed: OPEN REDUCTION INTERNAL FIXATION (ORIF) RIGHT ANKLE FRACTURE (Right Ankle)  Patient Location: PACU  Anesthesia Type:General and Regional  Level of Consciousness: awake, alert , oriented and patient cooperative  Airway & Oxygen Therapy: Patient Spontanous Breathing and Patient connected to nasal cannula oxygen  Post-op Assessment: Report given to RN and Post -op Vital signs reviewed and stable  Post vital signs: Reviewed and stable  Last Vitals:  Vitals Value Taken Time  BP 106/57   Temp    Pulse 121   Resp 19   SpO2 100     Last Pain:  Vitals:   01/30/20 1054  TempSrc:   PainSc: 0-No pain         Complications: No complications documented.

## 2020-01-31 ENCOUNTER — Encounter (HOSPITAL_COMMUNITY): Payer: Self-pay | Admitting: Orthopedic Surgery

## 2020-01-31 NOTE — Anesthesia Postprocedure Evaluation (Signed)
Anesthesia Post Note  Patient: Doctor, hospital) Performed: OPEN REDUCTION INTERNAL FIXATION (ORIF) RIGHT ANKLE FRACTURE (Right Ankle)     Patient location during evaluation: PACU Anesthesia Type: General Level of consciousness: awake and alert Pain management: pain level controlled Vital Signs Assessment: post-procedure vital signs reviewed and stable Respiratory status: spontaneous breathing, nonlabored ventilation, respiratory function stable and patient connected to nasal cannula oxygen Cardiovascular status: blood pressure returned to baseline and stable Postop Assessment: no apparent nausea or vomiting Anesthetic complications: no   No complications documented.  Last Vitals:  Vitals:   01/30/20 1235 01/30/20 1250  BP: 120/83 118/81  Pulse: 96 89  Resp: 16 17  Temp:  36.8 C  SpO2: 100% 100%    Last Pain:  Vitals:   01/30/20 1250  TempSrc:   PainSc: 2    Pain Goal:                   Rachael Cook

## 2020-02-11 ENCOUNTER — Encounter: Payer: Self-pay | Admitting: Physician Assistant

## 2020-02-11 ENCOUNTER — Ambulatory Visit: Payer: Self-pay

## 2020-02-11 ENCOUNTER — Ambulatory Visit (INDEPENDENT_AMBULATORY_CARE_PROVIDER_SITE_OTHER): Payer: Medicaid Other | Admitting: Physician Assistant

## 2020-02-11 VITALS — Ht 64.0 in | Wt 130.0 lb

## 2020-02-11 DIAGNOSIS — M25571 Pain in right ankle and joints of right foot: Secondary | ICD-10-CM

## 2020-02-11 MED ORDER — OXYCODONE-ACETAMINOPHEN 5-325 MG PO TABS
1.0000 | ORAL_TABLET | ORAL | 0 refills | Status: DC | PRN
Start: 2020-02-11 — End: 2021-02-06

## 2020-02-11 NOTE — Progress Notes (Signed)
Office Visit Note   Patient: Rachael Cook           Date of Birth: 06/27/2000           MRN: [id number] Visit Date: 02/11/2020              Requested by: [facility], [facility] [address] [place],  [place] [address] PCP: [facility], [facility]  Chief Complaint  Patient presents with   Right Ankle - Routine Post Op    01/30/20 ORIF right ankle fx       HPI: Patient is 12 days status post open reduction internal fixation of a right medial malleolus fracture.  She denies fever, chills, or calf pain.  She is requesting a refill of her pain medication.  Assessment & Plan: Visit Diagnoses:  1. Pain in right ankle and joints of right foot     Plan: She may put some weight on her ankle now but should be using her crutches.  She should work on ankle range of motion and I demonstrated this to her today.  Follow-up in 2 weeks Follow-Up Instructions: No follow-ups on file.   Ortho Exam  Patient is alert, oriented, no adenopathy, well-dressed, normal affect, normal respiratory effort. Right ankle well-healed surgical incision surgical sutures were removed today.  Mild soft tissue swelling and resolving ecchymosis.  Compartments are soft and nontender no cellulitis  Imaging: No results found. No images are attached to the encounter.  Labs: No results found for: HGBA1C, ESRSEDRATE, CRP, LABURIC, REPTSTATUS, GRAMSTAIN, CULT, LABORGA   Lab Results  Component Value Date   ALBUMIN 4.8 01/27/2019    No results found for: MG No results found for: VD25OH  No results found for: PREALBUMIN CBC EXTENDED Latest Ref Rng & Units 01/27/2019  WBC 4.0 - 10.5 K/uL 10.6(H)  RBC 3.87 - 5.11 MIL/uL 4.44  HGB 12.0 - 15.0 g/dL 30.8  HCT 36 - 46 % 65.7  PLT 150 - 400 K/uL 210     Body mass index is 22.31 kg/m.  Orders:  Orders Placed This Encounter  Procedures   XR Ankle Complete Right   No orders of the defined types were placed in this encounter.    Procedures: No  procedures performed  Clinical Data: No additional findings.  ROS:  All other systems negative, except as noted in the HPI. Review of Systems  Objective: Vital Signs: Ht 5\' 4"  (1.626 m)    Wt 130 lb (59 kg)    LMP 01/28/2020    BMI 22.31 kg/m   Specialty Comments:  No specialty comments available.  PMFS History: Patient Active Problem List   Diagnosis Date Noted   Closed displaced fracture of medial malleolus of right tibia    Gonorrhea in female 02/01/2019   IUD (intrauterine device) in place 09/25/2018   Abnormal uterine bleeding (AUB) 09/25/2018   Chlamydia infection 04/09/2018   Past Medical History:  Diagnosis Date   Sickle cell trait (HCC)     Family History  Problem Relation Age of Onset   Cancer - Colon Maternal Grandmother     Past Surgical History:  Procedure Laterality Date   ORIF ANKLE FRACTURE Right 01/30/2020   Procedure: OPEN REDUCTION INTERNAL FIXATION (ORIF) RIGHT ANKLE FRACTURE;  Surgeon: 03/31/2020, MD;  Location: [facility] OR;  Service: Orthopedics;  Laterality: Right;   WISDOM TOOTH EXTRACTION     Social History   Occupational History   Not on file  Tobacco Use   Smoking status: Never Smoker  Smokeless tobacco: Never Used  Vaping Use   Vaping Use: Never used  Substance and Sexual Activity   Alcohol use: No   Drug use: No   Sexual activity: Yes    Partners: Male    Birth control/protection: I.U.D., Injection

## 2020-02-25 ENCOUNTER — Encounter: Payer: Self-pay | Admitting: Physician Assistant

## 2020-02-25 ENCOUNTER — Ambulatory Visit (INDEPENDENT_AMBULATORY_CARE_PROVIDER_SITE_OTHER): Payer: Medicaid Other | Admitting: Physician Assistant

## 2020-02-25 VITALS — Ht 64.0 in | Wt 130.0 lb

## 2020-02-25 DIAGNOSIS — S8251XA Displaced fracture of medial malleolus of right tibia, initial encounter for closed fracture: Secondary | ICD-10-CM

## 2020-02-25 NOTE — Progress Notes (Signed)
Office Visit Note   Patient: Rachael Cook           Date of Birth: 13-Aug-2000           MRN: [id number] Visit Date: 02/25/2020              Requested by: [facility], [facility] [address] [place],  [place] [address] PCP: [facility], [facility]  Chief Complaint  Patient presents with  . Right Ankle - Routine Post Op    01/30/20 ORIF right ankle fx       HPI: Patient presents today 4 weeks status post ORIF of right medial malleolus fracture.  Overall she is doing well using crutches no complaints.  She denies fever, chills, or calf pain.  Assessment & Plan: Visit Diagnoses:  1. Displaced fracture of medial malleolus of right tibia, initial encounter for closed fracture     Plan: Patient was seen today by Dr. Lajoyce Corners he has recommended physical therapy.  She will follow-up in 3 weeks.  At that time x-rays of her ankle should be obtained with her ankle on the lateral and dorsiflexion if possible  Follow-Up Instructions: No follow-ups on file.   Ortho Exam  Patient is alert, oriented, no adenopathy, well-dressed, normal affect, normal respiratory effort. Well-healed surgical incision minimal soft tissue swelling distal CMS is intact no cellulitis no drainage no erythema mildly tender to palpation over the fracture site.  Can flex to just past neutral  Imaging: No results found. No images are attached to the encounter.  Labs: No results found for: HGBA1C, ESRSEDRATE, CRP, LABURIC, REPTSTATUS, GRAMSTAIN, CULT, LABORGA   Lab Results  Component Value Date   ALBUMIN 4.8 01/27/2019    No results found for: MG No results found for: VD25OH  No results found for: PREALBUMIN CBC EXTENDED Latest Ref Rng & Units 01/27/2019  WBC 4.0 - 10.5 K/uL 10.6(H)  RBC 3.87 - 5.11 MIL/uL 4.44  HGB 12.0 - 15.0 g/dL 54.6  HCT 36 - 46 % 50.3  PLT 150 - 400 K/uL 210     Body mass index is 22.31 kg/m.  Orders:  Orders Placed This Encounter  Procedures  . Ambulatory referral to  Physical Therapy   No orders of the defined types were placed in this encounter.    Procedures: No procedures performed  Clinical Data: No additional findings.  ROS:  All other systems negative, except as noted in the HPI. Review of Systems  Objective: Vital Signs: Ht 5\' 4"  (1.626 m)   Wt 130 lb (59 kg)   LMP 01/28/2020   BMI 22.31 kg/m   Specialty Comments:  No specialty comments available.  PMFS History: Patient Active Problem List   Diagnosis Date Noted  . Closed displaced fracture of medial malleolus of right tibia   . Gonorrhea in female 02/01/2019  . IUD (intrauterine device) in place 09/25/2018  . Abnormal uterine bleeding (AUB) 09/25/2018  . Chlamydia infection 04/09/2018   Past Medical History:  Diagnosis Date  . Sickle cell trait (HCC)     Family History  Problem Relation Age of Onset  . Cancer - Colon Maternal Grandmother     Past Surgical History:  Procedure Laterality Date  . ORIF ANKLE FRACTURE Right 01/30/2020   Procedure: OPEN REDUCTION INTERNAL FIXATION (ORIF) RIGHT ANKLE FRACTURE;  Surgeon: 03/31/2020, MD;  Location: [facility] OR;  Service: Orthopedics;  Laterality: Right;  . WISDOM TOOTH EXTRACTION     Social History   Occupational History  . Not on  file  Tobacco Use  . Smoking status: Never Smoker  . Smokeless tobacco: Never Used  Vaping Use  . Vaping Use: Never used  Substance and Sexual Activity  . Alcohol use: No  . Drug use: No  . Sexual activity: Yes    Partners: Male    Birth control/protection: I.U.D., Injection

## 2020-03-17 ENCOUNTER — Ambulatory Visit (INDEPENDENT_AMBULATORY_CARE_PROVIDER_SITE_OTHER): Payer: Medicaid Other | Admitting: Physician Assistant

## 2020-03-17 ENCOUNTER — Ambulatory Visit: Payer: Self-pay

## 2020-03-17 ENCOUNTER — Encounter: Payer: Self-pay | Admitting: Physician Assistant

## 2020-03-17 VITALS — Ht 64.0 in | Wt 130.0 lb

## 2020-03-17 DIAGNOSIS — S8251XA Displaced fracture of medial malleolus of right tibia, initial encounter for closed fracture: Secondary | ICD-10-CM

## 2020-03-17 NOTE — Progress Notes (Signed)
Office Visit Note   Patient: Rachael Cook           Date of Birth: [birth year]           MRN: [id number] Visit Date: 03/17/2020              Requested by: [facility], [facility] [address] [place],  [place] [address] PCP: [facility]  Chief Complaint  Patient presents with  . Right Ankle - Follow-up    01/30/2020 ORIF Right ankle fx      HPI: Patient is 6 weeks status post open reduction internal fixation of right medial malleolus fracture.  She has been working on physical therapy on her own.  She still has some pain medially but is not taking or requesting any medication  Assessment & Plan: Visit Diagnoses:  1. Displaced fracture of medial malleolus of right tibia, initial encounter for closed fracture     Plan: She should discontinue the boot and go into a supportive tennis shoe hightop if she has 1 available.  She may begin weightbearing without the boot around the house and should increase this.  She should also work on lower body strengthening and we discussed ways she could do this on her own.  She will follow-up in 6 weeks or sooner if she is having any difficulties We also discussed using vitamin E oil and/or Mederma cream and keeping her incision out of the sun Follow-Up Instructions: No follow-ups on file.   Ortho Exam  Patient is alert, oriented, no adenopathy, well-dressed, normal affect, normal respiratory effort. Focused examination demonstrates healed surgical incision.  She does have some keloid scarring.  No erythema no cellulitis compartments in her calf are soft and nontender.  Imaging: No results found. No images are attached to the encounter.  Labs: No results found for: HGBA1C, ESRSEDRATE, CRP, LABURIC, REPTSTATUS, GRAMSTAIN, CULT, LABORGA   Lab Results  Component Value Date   ALBUMIN 4.8 01/27/2019    No results found for: MG No results found for: VD25OH  No results found for: PREALBUMIN CBC EXTENDED Latest Ref Rng & Units  01/27/2019  WBC 4.0 - 10.5 K/uL 10.6(H)  RBC 3.87 - 5.11 MIL/uL 4.44  HGB 12.0 - 15.0 g/dL 51.7  HCT 36 - 46 % 61.6  PLT 150 - 400 K/uL 210     Body mass index is 22.31 kg/m.  Orders:  Orders Placed This Encounter  Procedures  . XR Ankle Complete Right   No orders of the defined types were placed in this encounter.    Procedures: No procedures performed  Clinical Data: No additional findings.  ROS:  All other systems negative, except as noted in the HPI. Review of Systems  Objective: Vital Signs: Ht 5\' 4"  (1.626 m)   Wt 130 lb (59 kg)   BMI 22.31 kg/m   Specialty Comments:  No specialty comments available.  PMFS History: Patient Active Problem List   Diagnosis Date Noted  . Closed displaced fracture of medial malleolus of right tibia   . Gonorrhea in female 02/01/2019  . IUD (intrauterine device) in place 09/25/2018  . Abnormal uterine bleeding (AUB) 09/25/2018  . Chlamydia infection 04/09/2018   Past Medical History:  Diagnosis Date  . Sickle cell trait (HCC)     Family History  Problem Relation Age of Onset  . Cancer - Colon Maternal Grandmother     Past Surgical History:  Procedure Laterality Date  . ORIF ANKLE FRACTURE Right 01/30/2020   Procedure: OPEN REDUCTION  INTERNAL FIXATION (ORIF) RIGHT ANKLE FRACTURE;  Surgeon: Nadara Mustard, MD;  Location[facility] OR;  Service: Orthopedics;  Laterality: Right;  . WISDOM TOOTH EXTRACTION     Social History   Occupational History  . Not on file  Tobacco Use  . Smoking status: Never Smoker  . Smokeless tobacco: Never Used  Vaping Use  . Vaping Use: Never used  Substance and Sexual Activity  . Alcohol use: No  . Drug use: No  . Sexual activity: Yes    Partners: Male    Birth control/protection: I.U.D., Injection

## 2020-04-28 ENCOUNTER — Ambulatory Visit: Payer: Medicaid Other | Admitting: Physician Assistant

## 2020-04-30 ENCOUNTER — Ambulatory Visit: Payer: Medicaid Other | Admitting: Physician Assistant

## 2020-06-16 ENCOUNTER — Ambulatory Visit: Payer: Medicaid Other | Admitting: Advanced Practice Midwife

## 2020-07-01 ENCOUNTER — Other Ambulatory Visit: Payer: Self-pay

## 2020-07-01 ENCOUNTER — Other Ambulatory Visit (HOSPITAL_COMMUNITY)
Admission: RE | Admit: 2020-07-01 | Discharge: 2020-07-01 | Disposition: A | Discharge status: Home / Self Care | Payer: Medicaid Other | Source: Ambulatory Visit | Attending: Advanced Practice Midwife | Admitting: Advanced Practice Midwife

## 2020-07-01 ENCOUNTER — Ambulatory Visit (INDEPENDENT_AMBULATORY_CARE_PROVIDER_SITE_OTHER): Payer: Medicaid Other | Admitting: Advanced Practice Midwife

## 2020-07-01 ENCOUNTER — Encounter: Payer: Self-pay | Admitting: Advanced Practice Midwife

## 2020-07-01 VITALS — BP 119/79 | HR 89 | Wt 121.0 lb

## 2020-07-01 DIAGNOSIS — Z0000 Encounter for general adult medical examination without abnormal findings: Secondary | ICD-10-CM | POA: Diagnosis not present

## 2020-07-01 DIAGNOSIS — Z113 Encounter for screening for infections with a predominantly sexual mode of transmission: Secondary | ICD-10-CM | POA: Diagnosis not present

## 2020-07-01 NOTE — Progress Notes (Signed)
GYNECOLOGY ANNUAL PREVENTATIVE CARE ENCOUNTER NOTE  History:     Rachael Cook is a 20 y.o. G0P0000 female here for a routine annual gynecologic exam.  Current complaints: none.   Denies abnormal vaginal bleeding, discharge, pelvic pain, problems with intercourse or other gynecologic concerns.    Patient has previously tried contraception with pills, Nexplanon and IUD. She has been dissatisfied with all methods. She has discontinued all except condoms, which she states she uses 100% of the time. She declines contraception counseling today.   Gynecologic History Patient's last menstrual period was 06/27/2020. Contraception: condoms Last Pap: N/A age 33.   Obstetric History OB History  Gravida Para Term Preterm AB Living  0 0 0 0 0 0  SAB IAB Ectopic Multiple Live Births  0 0 0 0 0    Past Medical History:  Diagnosis Date  . Sickle cell trait [facility])     Past Surgical History:  Procedure Laterality Date  . ORIF ANKLE FRACTURE Right 01/30/2020   Procedure: OPEN REDUCTION INTERNAL FIXATION (ORIF) RIGHT ANKLE FRACTURE;  Surgeon: Nadara Mustard, MD;  Location: [facility] OR;  Service: Orthopedics;  Laterality: Right;  . WISDOM TOOTH EXTRACTION      Current Outpatient Medications on File Prior to Visit  Medication Sig Dispense Refill  . Aspirin Buf,AlHyd-MgHyd-CaCar, (ASCRIPTIN) 325 MG TABS Take 1 tablet by mouth daily. (Patient not taking: Reported on 07/01/2020) 100 tablet 2  . estradiol cypionate (DEPO-ESTRADIOL) 5 MG/ML injection Inject into the muscle every 3 (three) months. (Patient not taking: Reported on 07/01/2020)    . Levonorgestrel (KYLEENA IU) 1 Device by Intrauterine route.  (Patient not taking: Reported on 07/01/2020)    . norgestimate-ethinyl estradiol (ORTHO-CYCLEN) 0.25-35 MG-MCG tablet Take 1 tablet by mouth daily. (Patient not taking: Reported on 07/01/2020) 1 Package 2  . oxyCODONE-acetaminophen (PERCOCET) 5-325 MG tablet Take 1 tablet by mouth every 4 (four) hours as  needed. (Patient not taking: No sig reported) 30 tablet 0   No current facility-administered medications on file prior to visit.    No Known Allergies  Social History:  reports that she has never smoked. She has never used smokeless tobacco. She reports that she does not drink alcohol and does not use drugs.  Family History  Problem Relation Age of Onset  . Cancer - Colon Maternal Grandmother     The following portions of the patient's history were reviewed and updated as appropriate: allergies, current medications, past family history, past medical history, past social history, past surgical history and problem list.  Review of Systems Pertinent items noted in HPI and remainder of comprehensive ROS otherwise negative.  Physical Exam:  BP 119/79   Pulse 89   Wt 121 lb (54.9 kg)   LMP 06/27/2020   BMI 20.77 kg/m  CONSTITUTIONAL: Well-developed, well-nourished female in no acute distress.  HENT:  Normocephalic, atraumatic, External right and left ear normal.  EYES: Conjunctivae and EOM are normal. Pupils are equal, round, and reactive to light. No scleral icterus.  NECK: Normal range of motion, supple, no masses.  Normal thyroid.  SKIN: Skin is warm and dry. No rash noted. Not diaphoretic. No erythema. No pallor. MUSCULOSKELETAL: Normal range of motion. No tenderness.  No cyanosis, clubbing, or edema.   NEUROLOGIC: Alert and oriented to person, place, and time. Normal reflexes, muscle tone coordination.  PSYCHIATRIC: Normal mood and affect. Normal behavior. Normal judgment and thought content. CARDIOVASCULAR: Normal heart rate noted, regular rhythm RESPIRATORY: Clear to auscultation bilaterally. Effort and  breath sounds normal, no problems with respiration noted. BREASTS: Symmetric in size. No masses, tenderness, skin changes, nipple drainage, or lymphadenopathy bilaterally. Performed in the presence of a chaperone. ABDOMEN: Soft, no distention noted.  No tenderness, rebound or  guarding.  PELVIC: Swab collected via blind swab   Assessment and Plan:    1. Well woman exam (no gynecological exam) - No abnormal findings on physical exam  2. Screening examination for STD (sexually transmitted disease)  - Cervicovaginal ancillary only( [place])   Routine preventative health maintenance measures emphasized. Please refer to After Visit Summary for other counseling recommendations.      Clayton Bibles, MSN, CNM Certified Nurse Midwife, [employer] for [employer], [facility] 07/01/20 2:54 PM

## 2020-07-01 NOTE — Patient Instructions (Signed)
Preventive Care 21-21 Years Old, Female Preventive care refers to lifestyle choices and visits with your health care provider that can promote health and wellness. This includes:  A yearly physical exam. This is also called an annual wellness visit.  Regular dental and eye exams.  Immunizations.  Screening for certain conditions.  Healthy lifestyle choices, such as: ? Eating a healthy diet. ? Getting regular exercise. ? Not using drugs or products that contain nicotine and tobacco. ? Limiting alcohol use. What can I expect for my preventive care visit? Physical exam Your health care provider may check your:  Height and weight. These may be used to calculate your BMI (body mass index). BMI is a measurement that tells if you are at a healthy weight.  Heart rate and blood pressure.  Body temperature.  Skin for abnormal spots. Counseling Your health care provider may ask you questions about your:  Past medical problems.  Family's medical history.  Alcohol, tobacco, and drug use.  Emotional well-being.  Home life and relationship well-being.  Sexual activity.  Diet, exercise, and sleep habits.  Work and work environment.  Access to firearms.  Method of birth control.  Menstrual cycle.  Pregnancy history. What immunizations do I need? Vaccines are usually given at various ages, according to a schedule. Your health care provider will recommend vaccines for you based on your age, medical history, and lifestyle or other factors, such as travel or where you work.   What tests do I need? Blood tests  Lipid and cholesterol levels. These may be checked every 5 years starting at age 20.  Hepatitis C test.  Hepatitis B test. Screening  Diabetes screening. This is done by checking your blood sugar (glucose) after you have not eaten for a while (fasting).  STD (sexually transmitted disease) testing, if you are at risk.  BRCA-related cancer screening. This may be  done if you have a family history of breast, ovarian, tubal, or peritoneal cancers.  Pelvic exam and Pap test. This may be done every 3 years starting at age 21. Starting at age 30, this may be done every 5 years if you have a Pap test in combination with an HPV test. Talk with your health care provider about your test results, treatment options, and if necessary, the need for more tests.   Follow these instructions at home: Eating and drinking  Eat a healthy diet that includes fresh fruits and vegetables, whole grains, lean protein, and low-fat dairy products.  Take vitamin and mineral supplements as recommended by your health care provider.  Do not drink alcohol if: ? Your health care provider tells you not to drink. ? You are pregnant, may be pregnant, or are planning to become pregnant.  If you drink alcohol: ? Limit how much you have to 0-1 drink a day. ? Be aware of how much alcohol is in your drink. In the [place] one drink equals one 12 oz bottle of beer (355 mL), one 5 oz glass of wine (148 mL), or one 1 oz glass of hard liquor (44 mL).   Lifestyle  Take daily care of your teeth and gums. Brush your teeth every morning and night with fluoride toothpaste. Floss one time each day.  Stay active. Exercise for at least 30 minutes 5 or more days each week.  Do not use any products that contain nicotine or tobacco, such as cigarettes, e-cigarettes, and chewing tobacco. If you need help quitting, ask your health care provider.  Do not   use drugs.  If you are sexually active, practice safe sex. Use a condom or other form of protection to prevent STIs (sexually transmitted infections).  If you do not wish to become pregnant, use a form of birth control. If you plan to become pregnant, see your health care provider for a prepregnancy visit.  Find healthy ways to cope with stress, such as: ? Meditation, yoga, or listening to music. ? Journaling. ? Talking to a trusted  person. ? Spending time with friends and family. Safety  Always wear your seat belt while driving or riding in a vehicle.  Do not drive: ? If you have been drinking alcohol. Do not ride with someone who has been drinking. ? When you are tired or distracted. ? While texting.  Wear a helmet and other protective equipment during sports activities.  If you have firearms in your house, make sure you follow all gun safety procedures.  Seek help if you have been physically or sexually abused. What's next?  Go to your health care provider once a year for an annual wellness visit.  Ask your health care provider how often you should have your eyes and teeth checked.  Stay up to date on all vaccines. This information is not intended to replace advice given to you by your health care provider. Make sure you discuss any questions you have with your health care provider. Document Revised: 01/13/2020 Document Reviewed: 01/26/2018 Elsevier Patient Education  2021 Elsevier Inc.  

## 2020-07-02 LAB — CERVICOVAGINAL ANCILLARY ONLY
Bacterial Vaginitis (gardnerella): POSITIVE — AB
Candida Glabrata: NEGATIVE
Candida Vaginitis: POSITIVE — AB
Chlamydia: NEGATIVE
Comment: NEGATIVE
Comment: NEGATIVE
Comment: NEGATIVE
Comment: NEGATIVE
Comment: NEGATIVE
Comment: NORMAL
Neisseria Gonorrhea: NEGATIVE
Trichomonas: NEGATIVE

## 2020-07-03 ENCOUNTER — Other Ambulatory Visit (INDEPENDENT_AMBULATORY_CARE_PROVIDER_SITE_OTHER): Payer: Medicaid Other | Admitting: Advanced Practice Midwife

## 2020-07-03 MED ORDER — METRONIDAZOLE 500 MG PO TABS: 500.0000 mg | ORAL_TABLET | Freq: Two times a day (BID) | ORAL | 0 refills | Status: DC

## 2020-07-03 MED ORDER — FLUCONAZOLE 150 MG PO TABS: 150.0000 mg | ORAL_TABLET | Freq: Once | ORAL | 0 refills | Status: AC

## 2020-07-03 NOTE — Progress Notes (Signed)
+   yeast and BV on swab collected 07/01/2020. Notified via active MyChart  Clayton Bibles, MSN, CNM Certified Nurse Midwife, [facility] for [employer], [facility] 07/03/20 8:33 AM

## 2020-08-14 DIAGNOSIS — Z113 Encounter for screening for infections with a predominantly sexual mode of transmission: Secondary | ICD-10-CM | POA: Diagnosis not present

## 2020-12-09 ENCOUNTER — Ambulatory Visit (INDEPENDENT_AMBULATORY_CARE_PROVIDER_SITE_OTHER): Payer: Medicaid Other

## 2020-12-09 ENCOUNTER — Other Ambulatory Visit (HOSPITAL_COMMUNITY)
Admission: RE | Admit: 2020-12-09 | Discharge: 2020-12-09 | Disposition: A | Discharge status: Home / Self Care | Payer: Medicaid Other | Source: Ambulatory Visit | Attending: Obstetrics and Gynecology | Admitting: Obstetrics and Gynecology

## 2020-12-09 ENCOUNTER — Encounter: Payer: Self-pay | Admitting: Obstetrics

## 2020-12-09 ENCOUNTER — Other Ambulatory Visit: Payer: Self-pay

## 2020-12-09 VITALS — BP 106/69 | HR 92 | Ht 64.0 in | Wt 124.0 lb

## 2020-12-09 DIAGNOSIS — N898 Other specified noninflammatory disorders of vagina: Secondary | ICD-10-CM | POA: Insufficient documentation

## 2020-12-09 NOTE — Progress Notes (Signed)
Patient was assessed and managed by nursing staff during this encounter. I have reviewed the chart and agree with the documentation and plan. I have also made any necessary editorial changes.  Catalina Antigua, MD 12/09/2020 2:03 PM

## 2020-12-09 NOTE — Progress Notes (Signed)
SUBJECTIVE:  20 y.o. female complains of malodorous, yellow vaginal discharge, itching, burning  for 3 day(s). Denies abnormal vaginal bleeding or significant pelvic pain or fever. No UTI symptoms. Denies history of known exposure to STD.  Patient's last menstrual period was 12/01/2020 (exact date).  OBJECTIVE:  She appears well, afebrile. Urine dipstick: not done.  ASSESSMENT:  Vaginal Discharge  Vaginal Odor   PLAN:  GC, chlamydia, trichomonas, BVAG, CVAG probe sent to lab. Treatment: To be determined once lab results are received ROV prn if symptoms persist or worsen.

## 2020-12-10 LAB — CERVICOVAGINAL ANCILLARY ONLY
Bacterial Vaginitis (gardnerella): POSITIVE — AB
Candida Glabrata: NEGATIVE
Candida Vaginitis: NEGATIVE
Chlamydia: NEGATIVE
Comment: NEGATIVE
Comment: NEGATIVE
Comment: NEGATIVE
Comment: NEGATIVE
Comment: NEGATIVE
Comment: NORMAL
Neisseria Gonorrhea: NEGATIVE
Trichomonas: NEGATIVE

## 2020-12-11 MED ORDER — METRONIDAZOLE 500 MG PO TABS: 500.0000 mg | ORAL_TABLET | Freq: Two times a day (BID) | ORAL | 0 refills | Status: DC

## 2020-12-11 NOTE — Addendum Note (Signed)
Addended by: Catalina Antigua on: 12/11/2020 04:04 PM   Modules accepted: Orders

## 2020-12-19 ENCOUNTER — Other Ambulatory Visit: Payer: Self-pay

## 2020-12-19 ENCOUNTER — Ambulatory Visit
Admission: RE | Admit: 2020-12-19 | Discharge: 2020-12-19 | Disposition: A | Discharge status: Home / Self Care | Payer: Medicaid Other | Source: Ambulatory Visit | Attending: Emergency Medicine | Admitting: Emergency Medicine

## 2020-12-19 VITALS — BP 101/69 | HR 96 | Temp 98.6°F | Resp 18

## 2020-12-19 DIAGNOSIS — J029 Acute pharyngitis, unspecified: Secondary | ICD-10-CM

## 2020-12-19 LAB — POCT RAPID STREP A (OFFICE): Rapid Strep A Screen: NEGATIVE

## 2020-12-19 MED ORDER — LIDOCAINE VISCOUS HCL 2 % MT SOLN: 5.0000 mL | OROMUCOSAL | 0 refills | Status: DC | PRN

## 2020-12-19 NOTE — ED Provider Notes (Signed)
CHIEF COMPLAINT:   Chief Complaint  Patient presents with   Sore Throat     SUBJECTIVE/HPI:   Sore Throat  A very pleasant 20 y.o.Female presents today with sore throat for the last 2 days.  Patient also reports that her "nose is a little stuffed up".  She does not report any known sick contacts or exposure to COVID-19.  Patient does need a work note for today. Patient does not report any shortness of breath, chest pain, palpitations, visual changes, weakness, tingling, headache, nausea, vomiting, diarrhea, fever, chills.   has a past medical history of Sickle cell trait (HCC).  ROS:  Review of Systems See Subjective/HPI Medications, Allergies and Problem List personally reviewed in Epic today OBJECTIVE:   Vitals:   12/19/20 0915  BP: 101/69  Pulse: 96  Resp: 18  Temp: 98.6 F (37 C)  SpO2: 98%    Physical Exam   General: Appears well-developed and well-nourished. No acute distress.  HEENT Head: Normocephalic and atraumatic.   Ears: Hearing grossly intact, no drainage or visible deformity.  Nose: No nasal deviation.   Mouth/Throat: No stridor or tracheal deviation.  Non erythematous posterior pharynx noted with clear drainage present.  No white patchy exudate noted. Eyes: Conjunctivae and EOM are normal. No eye drainage or scleral icterus bilaterally.  Neck: Normal range of motion, neck is supple.  Cardiovascular: Normal rate. Regular rhythm; no murmurs, gallops, or rubs.  Pulm/Chest: No respiratory distress. Breath sounds normal bilaterally without wheezes, rhonchi, or rales.  Neurological: Alert and oriented to person, place, and time.  Skin: Skin is warm and dry.  No rashes, lesions, abrasions or bruising noted to skin.   Psychiatric: Normal mood, affect, behavior, and thought content.   Vital signs and nursing note reviewed.   Patient stable and cooperative with examination. PROCEDURES:    LABS/X-RAYS/EKG/MEDS:   Results for orders placed or performed during  the hospital encounter of 12/19/20  POCT rapid strep A  Result Value Ref Range   Rapid Strep A Screen Negative Negative   -Strep negative MEDICAL DECISION MAKING:   Patient presents with sore throat for the last 2 days.  Patient also reports that her "nose is a little stuffed up".  She does not report any known sick contacts or exposure to COVID-19.  Patient does need a work note for today. Patient does not report any shortness of breath, chest pain, palpitations, visual changes, weakness, tingling, headache, nausea, vomiting, diarrhea, fever, chills.  Chart review completed.  Strep negative.  Likely, viral pharyngitis.  Rx'd viscous lidocaine to the patient's preferred pharmacy and advised about home treatment and care as outlined in her AVS.  Return as needed.  Work note provided.  Patient verbalized understanding and agreed with treatment plan.  Patient stable upon discharge. ASSESSMENT/PLAN:  1. Viral pharyngitis  Meds ordered this encounter  Medications   lidocaine (XYLOCAINE) 2 % solution    Sig: Use as directed 5 mLs in the mouth or throat as needed for mouth pain.    Dispense:  100 mL    Refill:  0    Order Specific Question:   Supervising Provider    Answer:   Merrilee Jansky [id number]    Instructions about new medications and side effects provided.  Plan:   Discharge Instructions      Your strep test today was negative. The symptoms you are experiencing are most likely caused by a virus which does not require treatment with antibiotics. Drink plenty of fluids and  eat soft foods. Take Tylenol and ibuprofen as directed for pain or fever, as long as there are no contraindications to using these medications.  Use warm salt water gargles for throat pain as needed. Throat lozenges, cepachol, cough drops can be helpful as can warm tea with honey. Follow up with PCP or return to clinic if not improving as expected in the next 5 to 7 days.  Return for persistent fever, drooling,  swelling in the throat, difficulty breathing, neck stiffness.        A copy of these instructions have been given to the patient or responsible adult who demonstrated the ability to learn, asked appropriate questions, and verbalized understanding of the plan of care.  There were no barriers to learning identified.    Amalia Greenhouse, FNP-C 12/19/20  This note was partially made with the aid of speech-to-text dictation; typographical errors are not intentional.    Amalia Greenhouse, [facility] 12/19/20 [phone]

## 2020-12-19 NOTE — Discharge Instructions (Signed)
Your strep test today was negative. The symptoms you are experiencing are most likely caused by a virus which does not require treatment with antibiotics. Drink plenty of fluids and eat soft foods. Take Tylenol and ibuprofen as directed for pain or fever, as long as there are no contraindications to using these medications.  Use warm salt water gargles for throat pain as needed. Throat lozenges, cepachol, cough drops can be helpful as can warm tea with honey. Follow up with PCP or return to clinic if not improving as expected in the next 5 to 7 days.  Return for persistent fever, drooling, swelling in the throat, difficulty breathing, neck stiffness.  

## 2020-12-19 NOTE — ED Triage Notes (Signed)
Pt presents today with c/o of sore throat x 2 days. Denies fever.

## 2021-02-06 ENCOUNTER — Ambulatory Visit: Payer: No Typology Code available for payment source | Attending: Nurse Practitioner | Admitting: Nurse Practitioner

## 2021-02-06 ENCOUNTER — Other Ambulatory Visit: Payer: Self-pay

## 2021-02-06 ENCOUNTER — Encounter: Payer: Self-pay | Admitting: Nurse Practitioner

## 2021-02-06 VITALS — BP 110/74 | HR 84 | Ht 64.0 in | Wt 129.0 lb

## 2021-02-06 DIAGNOSIS — Z7689 Persons encountering health services in other specified circumstances: Secondary | ICD-10-CM | POA: Diagnosis not present

## 2021-02-06 DIAGNOSIS — R41840 Attention and concentration deficit: Secondary | ICD-10-CM

## 2021-02-06 NOTE — Progress Notes (Signed)
Assessment & Plan:  Rachael Cook was seen today for establish care.  Diagnoses and all orders for this visit:  Encounter to establish care with new doctor  Difficulty concentrating -     Ambulatory referral to Psychiatry   Patient has been counseled on age-appropriate routine health concerns for screening and prevention. These are reviewed and up-to-date. Referrals have been placed accordingly. Immunizations are up-to-date or declined.    Subjective:   Chief Complaint  Patient presents with   Establish Care    HPI Rachael Cook 20 y.o. female presents to office today to establish care. She has a past medical history of Sickle cell trait (HCC).   She is currently using condom for contraception. Has a history of frequent BV infections.   States it is hard for her to focus or concentrate. Started when she was in middle school. States grades were average. 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 She does have trouble sleeping at night.    Review of Systems  Constitutional:  Negative for fever, malaise/fatigue and weight loss.  HENT: Negative.  Negative for nosebleeds.   Eyes: Negative.  Negative for blurred vision, double vision and photophobia.  Respiratory: Negative.  Negative for cough and shortness of breath.   Cardiovascular: Negative.  Negative for chest pain, palpitations and leg swelling.  Gastrointestinal: Negative.  Negative for heartburn, nausea and vomiting.  Musculoskeletal: Negative.  Negative for myalgias.  Neurological: Negative.  Negative for dizziness, focal weakness, seizures and headaches.  Psychiatric/Behavioral:  Positive for memory loss. Negative for suicidal ideas.    Past Medical History:  Diagnosis Date   Sickle cell trait [facility])     Past Surgical History:  Procedure Laterality Date   ORIF ANKLE FRACTURE Right 01/30/2020   Procedure: OPEN REDUCTION INTERNAL FIXATION (ORIF) RIGHT ANKLE  FRACTURE;  Surgeon: Nadara Mustard, MD;  Location: [facility] OR;  Service: Orthopedics;  Laterality: Right;   WISDOM TOOTH EXTRACTION      Family History  Problem Relation Age of Onset   Cancer - Colon Maternal Grandmother     Social History Reviewed with no changes to be made today.   Outpatient Medications Prior to Visit  Medication Sig Dispense Refill   estradiol cypionate (DEPO-ESTRADIOL) 5 MG/ML injection Inject into the muscle every 3 (three) months. (Patient not taking: No sig reported)     Levonorgestrel (KYLEENA IU) 1 Device by Intrauterine route.  (Patient not taking: No sig reported)     lidocaine (XYLOCAINE) 2 % solution Use as directed 5 mLs in the mouth or throat as needed for mouth pain. (Patient not taking: Reported on 02/06/2021) 100 mL 0   metroNIDAZOLE (FLAGYL) 500 MG tablet Take 1 tablet (500 mg total) by mouth 2 (two) times daily. (Patient not taking: No sig reported) 14 tablet 0   metroNIDAZOLE (FLAGYL) 500 MG tablet Take 1 tablet (500 mg total) by mouth 2 (two) times daily. (Patient not taking: Reported on 02/06/2021) 14 tablet 0   norgestimate-ethinyl estradiol (ORTHO-CYCLEN) 0.25-35 MG-MCG tablet Take 1 tablet by mouth daily. (Patient not taking: No sig reported) 1 Package 2   oxyCODONE-acetaminophen (PERCOCET) 5-325 MG tablet Take 1 tablet by mouth every 4 (four) hours as needed. (Patient not taking: No sig reported) 30 tablet 0   No facility-administered medications prior to visit.    No Known Allergies     Objective:    BP 110/74   Pulse 84   Ht  5\' 4"  (1.626 m)   Wt 129 lb (58.5 kg)   LMP 01/23/2021 (Exact Date)   SpO2 99%   BMI 22.14 kg/m  Wt Readings from Last 3 Encounters:  02/06/21 129 lb (58.5 kg)  12/09/20 124 lb (56.2 kg)  07/01/20 121 lb (54.9 kg)    Physical Exam Vitals and nursing note reviewed.  Constitutional:      Appearance: She is well-developed.  HENT:     Head: Normocephalic and atraumatic.  Cardiovascular:     Rate and Rhythm:  Normal rate and regular rhythm.     Heart sounds: Normal heart sounds. No murmur heard.   No friction rub. No gallop.  Pulmonary:     Effort: Pulmonary effort is normal. No tachypnea or respiratory distress.     Breath sounds: Normal breath sounds. No decreased breath sounds, wheezing, rhonchi or rales.  Chest:     Chest wall: No tenderness.  Abdominal:     General: Bowel sounds are normal.     Palpations: Abdomen is soft.  Musculoskeletal:        General: Normal range of motion.     Cervical back: Normal range of motion.  Skin:    General: Skin is warm and dry.  Neurological:     Mental Status: She is alert and oriented to person, place, and time.     Coordination: Coordination normal.  Psychiatric:        Mood and Affect: Mood normal.        Speech: Speech normal.        Behavior: Behavior normal. Behavior is cooperative.        Thought Content: Thought content normal.        Judgment: Judgment normal.         Patient has been counseled extensively about nutrition and exercise as well as the importance of adherence with medications and regular follow-up. The patient was given clear instructions to go to ER or return to medical center if symptoms don't improve, worsen or new problems develop. The patient verbalized understanding.   Follow-up: Return for physical and labs.   08/29/20, FNP-BC [facility] [place], [place]   02/06/2021, 2:29 PM

## 2021-03-24 ENCOUNTER — Other Ambulatory Visit: Payer: Self-pay

## 2021-03-24 ENCOUNTER — Ambulatory Visit
Admission: EM | Admit: 2021-03-24 | Discharge: 2021-03-24 | Disposition: A | Discharge status: Home / Self Care | Payer: Medicaid Other | Attending: Emergency Medicine | Admitting: Emergency Medicine

## 2021-03-24 DIAGNOSIS — R519 Headache, unspecified: Secondary | ICD-10-CM | POA: Diagnosis not present

## 2021-03-24 DIAGNOSIS — J029 Acute pharyngitis, unspecified: Secondary | ICD-10-CM | POA: Diagnosis not present

## 2021-03-24 DIAGNOSIS — J069 Acute upper respiratory infection, unspecified: Secondary | ICD-10-CM | POA: Insufficient documentation

## 2021-03-24 DIAGNOSIS — N926 Irregular menstruation, unspecified: Secondary | ICD-10-CM | POA: Insufficient documentation

## 2021-03-24 LAB — POCT RAPID STREP A (OFFICE): Rapid Strep A Screen: NEGATIVE

## 2021-03-24 LAB — POCT URINE PREGNANCY: Preg Test, Ur: NEGATIVE

## 2021-03-24 NOTE — Discharge Instructions (Signed)
You were tested for COVID, flu and rapid strep today.  Your rapid strep test today was negative.  Per protocol, we will perform a throat culture which takes 3 to 5 days.  If the result is positive, you will be provided with a prescription for antibiotics.  In the meantime, conservative care is recommended.  Please read below for medications that may provide you with some symptomatic relief.  Conservative care includes rest, pushing clear fluids and activity as tolerated.  You may also noticed that your appetite is reduced, this is okay as long as you are drinking plenty of clear fluids.  Acetaminophen: This is a good fever reducer.  If your body temperature rises above 101.5 as measured with a thermometer, it is recommended that you take 1000 mg every 8 hours until your temperature falls below 101.5  Ibuprofen: This is a good anti-inflammatory medication which addresses aches and pains and, to some degree, congestion in the nasal passages.  I recommend taking between 200 to 400 mg every 8 hours as needed.  Pseudoephedrine: This is a decongestant.  This medication has to be purchased from the pharmacist counter, I recommend taking 2 tablets, 60 mg, 2-3 times a day as needed to relieve runny nose and sinus drainage.  Guaifenesin: This is an expectorant.  This helps break up chest congestion and loosen up thick nasal drainage making phlegm and drainage more liquid and therefore easier to remove.  I recommend taking 400 mg 3 times daily as needed.  Dextromethorphan: This is a cough suppressant.  This is often recommended to be taken at nighttime to suppress cough and help people sleep.

## 2021-03-24 NOTE — ED Triage Notes (Addendum)
Pt reports since yesterday she has had a sore throat, she denies having a fever or cough.

## 2021-03-24 NOTE — ED Provider Notes (Signed)
[facility]    CSN: [id number] Arrival date & time: 03/24/21  1258      History   Chief Complaint Chief Complaint  Patient presents with   Sore Throat    HPI Rachael Cook is a 20 y.o. female.   Patient reports a 1 day history of sore throat.  Patient denies fever, cough, aches, chills, nausea, vomiting, diarrhea, headache, loss of taste or smell, shortness of breath.  On arrival today, patient's vital signs are normal.  Per my observation, patient is well-appearing.  The history is provided by the patient.   Past Medical History:  Diagnosis Date   Sickle cell trait [facility])     Patient Active Problem List   Diagnosis Date Noted   Closed displaced fracture of medial malleolus of right tibia    Gonorrhea in female 02/01/2019   IUD (intrauterine device) in place 09/25/2018   Abnormal uterine bleeding (AUB) 09/25/2018   Chlamydia infection 04/09/2018    Past Surgical History:  Procedure Laterality Date   ORIF ANKLE FRACTURE Right 01/30/2020   Procedure: OPEN REDUCTION INTERNAL FIXATION (ORIF) RIGHT ANKLE FRACTURE;  Surgeon: Nadara Mustard, MD;  Location: [facility] OR;  Service: Orthopedics;  Laterality: Right;   WISDOM TOOTH EXTRACTION      OB History     Gravida  0   Para  0   Term  0   Preterm  0   AB  0   Living  0      SAB  0   IAB  0   Ectopic  0   Multiple  0   Live Births  0            Home Medications    Prior to Admission medications   Not on File    Family History Family History  Problem Relation Age of Onset   Cancer - Colon Maternal Grandmother     Social History Social History   Tobacco Use   Smoking status: Never   Smokeless tobacco: Never  Vaping Use   Vaping Use: Never used  Substance Use Topics   Alcohol use: No   Drug use: No     Allergies   Patient has no known allergies.   Review of Systems Review of Systems Pertinent findings noted in history of present illness.    Physical  Exam Triage Vital Signs ED Triage Vitals  Enc Vitals Group     BP      Pulse      Resp      Temp      Temp src      SpO2      Weight      Height      Head Circumference      Peak Flow      Pain Score      Pain Loc      Pain Edu?      Excl. in GC?    No data found.  Updated Vital Signs BP 101/65 (BP Location: Right Arm)   Pulse 75   Temp 98.2 F (36.8 C) (Oral)   Resp 18   LMP 02/17/2021   SpO2 97%   Visual Acuity Right Eye Distance:   Left Eye Distance:   Bilateral Distance:    Right Eye Near:   Left Eye Near:    Bilateral Near:     Physical Exam Vitals and nursing note reviewed.  Constitutional:      Appearance: Normal appearance.  HENT:     Head: Normocephalic and atraumatic.     Right Ear: Tympanic membrane, ear canal and external ear normal.     Left Ear: Tympanic membrane, ear canal and external ear normal.     Nose: Nose normal.     Mouth/Throat:     Mouth: Mucous membranes are moist.     Pharynx: Oropharynx is clear.  Eyes:     Extraocular Movements: Extraocular movements intact.     Conjunctiva/sclera: Conjunctivae normal.     Pupils: Pupils are equal, round, and reactive to light.  Cardiovascular:     Rate and Rhythm: Normal rate and regular rhythm.     Pulses: Normal pulses.     Heart sounds: Normal heart sounds.  Pulmonary:     Effort: Pulmonary effort is normal.     Breath sounds: Normal breath sounds.  Musculoskeletal:     Cervical back: Normal range of motion and neck supple.  Neurological:     General: No focal deficit present.     Mental Status: She is alert and oriented to person, place, and time. Mental status is at baseline.  Psychiatric:        Mood and Affect: Mood normal.        Behavior: Behavior normal.     UC Treatments / Results  Labs (all labs ordered are listed, but only abnormal results are displayed) Labs Reviewed  CULTURE, GROUP A STREP ([facility])  COVID-19, FLU A+B NAA  POCT URINE PREGNANCY  POCT RAPID STREP A  (OFFICE)    EKG   Radiology No results found.  Procedures Procedures (including critical care time)  Medications Ordered in UC Medications - No data to display  Initial Impression / Assessment and Plan / UC Course  I have reviewed the triage vital signs and the nursing notes.  Pertinent labs & imaging results that were available during my care of the patient were reviewed by me and considered in my medical decision making (see chart for details).     COVID, flu and streptococcal pharyngitis testing were performed today.  Rapid strep test was negative, throat culture sent per protocol.  Patient advised she will be treated with antibiotics if the throat culture is positive.  Conservative care recommended for her symptoms.  Patient verbalized understanding and agreement of plan as discussed.  All questions were addressed during visit.  Please see discharge instructions below for further details of plan.  Final Clinical Impressions(s) / UC Diagnoses   Final diagnoses:  Menstrual period late  Sore throat  Nonintractable headache, unspecified chronicity pattern, unspecified headache type  Upper respiratory tract infection, unspecified type     Discharge Instructions      You were tested for COVID, flu and rapid strep today.  Your rapid strep test today was negative.  Per protocol, we will perform a throat culture which takes 3 to 5 days.  If the result is positive, you will be provided with a prescription for antibiotics.  In the meantime, conservative care is recommended.  Please read below for medications that may provide you with some symptomatic relief.  Conservative care includes rest, pushing clear fluids and activity as tolerated.  You may also noticed that your appetite is reduced, this is okay as long as you are drinking plenty of clear fluids.  Acetaminophen: This is a good fever reducer.  If your body temperature rises above 101.5 as measured with a thermometer, it is  recommended that you take 1000 mg every 8 hours  until your temperature falls below 101.5  Ibuprofen: This is a good anti-inflammatory medication which addresses aches and pains and, to some degree, congestion in the nasal passages.  I recommend taking between 200 to 400 mg every 8 hours as needed.  Pseudoephedrine: This is a decongestant.  This medication has to be purchased from the pharmacist counter, I recommend taking 2 tablets, 60 mg, 2-3 times a day as needed to relieve runny nose and sinus drainage.  Guaifenesin: This is an expectorant.  This helps break up chest congestion and loosen up thick nasal drainage making phlegm and drainage more liquid and therefore easier to remove.  I recommend taking 400 mg 3 times daily as needed.  Dextromethorphan: This is a cough suppressant.  This is often recommended to be taken at nighttime to suppress cough and help people sleep.      ED Prescriptions   None    PDMP not reviewed this encounter.   Theadora Rama Scales, [place] 03/25/21 [phone]

## 2021-03-25 ENCOUNTER — Ambulatory Visit: Payer: Medicaid Other | Admitting: Nurse Practitioner

## 2021-03-26 LAB — COVID-19, FLU A+B NAA
Influenza A, NAA: NOT DETECTED
Influenza B, NAA: NOT DETECTED
SARS-CoV-2, NAA: NOT DETECTED

## 2021-03-28 LAB — CULTURE, GROUP A STREP (THRC)

## 2021-05-13 ENCOUNTER — Ambulatory Visit: Payer: Medicaid Other

## 2021-09-28 ENCOUNTER — Other Ambulatory Visit (HOSPITAL_COMMUNITY)
Admission: RE | Admit: 2021-09-28 | Discharge: 2021-09-28 | Disposition: A | Discharge status: Home / Self Care | Payer: Medicaid Other | Source: Ambulatory Visit | Attending: Advanced Practice Midwife | Admitting: Advanced Practice Midwife

## 2021-09-28 ENCOUNTER — Encounter: Payer: Self-pay | Admitting: Advanced Practice Midwife

## 2021-09-28 ENCOUNTER — Ambulatory Visit (INDEPENDENT_AMBULATORY_CARE_PROVIDER_SITE_OTHER): Payer: Medicaid Other | Admitting: Advanced Practice Midwife

## 2021-09-28 VITALS — BP 109/66 | HR 77 | Wt 129.0 lb

## 2021-09-28 DIAGNOSIS — Z124 Encounter for screening for malignant neoplasm of cervix: Secondary | ICD-10-CM | POA: Diagnosis not present

## 2021-09-28 DIAGNOSIS — Z01419 Encounter for gynecological examination (general) (routine) without abnormal findings: Secondary | ICD-10-CM

## 2021-09-28 DIAGNOSIS — Z113 Encounter for screening for infections with a predominantly sexual mode of transmission: Secondary | ICD-10-CM

## 2021-09-28 DIAGNOSIS — Z3009 Encounter for other general counseling and advice on contraception: Secondary | ICD-10-CM

## 2021-09-28 NOTE — Progress Notes (Signed)
? ?  Subjective:  ?  ? Rachael Cook is a 21 y.o. female here at [facility] for a routine exam.  Current complaints: none.  Personal health questionnaire reviewed: yes. ? ?Do you have a primary care provider? yes ?Do you feel safe at home? yes ? ?[place] Office Visit from 09/28/2021 in [place]  ?PHQ-2 Total Score 0  ? ?  ? ? ?Health Maintenance Due  ?Topic Date Due  ? COVID-19 Vaccine (1) Never done  ? HPV VACCINES (1 - 2-dose series) Never done  ? PAP-Cervical Cytology Screening  Never done  ? PAP SMEAR-Modifier  Never done  ?  ? ?Risk factors for chronic health problems: ?Smoking: ?Alchohol/how much: ?Pt BMI: Body mass index is 22.14 kg/m?. ?  ?Gynecologic History ?Patient's last menstrual period was 09/02/2021. ?Contraception: abstinence ?Last Pap: none.  ?Last mammogram: n/a.  ? ?Obstetric History ?OB History  ?Gravida Para Term Preterm AB Living  ?0 0 0 0 0 0  ?SAB IAB Ectopic Multiple Live Births  ?0 0 0 0 0  ? ? ? ?The following portions of the patient's history were reviewed and updated as appropriate: allergies, current medications, past family history, past medical history, past social history, past surgical history, and problem list. ? ?Review of Systems ?Pertinent items noted in HPI and remainder of comprehensive ROS otherwise negative.  ?  ?Objective:  ? ?BP 109/66   Pulse 77   Wt 129 lb (58.5 kg)   LMP 09/02/2021   BMI 22.14 kg/m?  ?VS reviewed, nursing note reviewed,  ?Constitutional: well developed, well nourished, no distress ?HEENT: normocephalic ?CV: normal rate ?Pulm/chest wall: normal effort ?Breast Exam:  Deferred with low risks and shared decision making, discussed recommendation to start mammogram between 40-50 yo/  ?Abdomen: soft ?Neuro: alert and oriented x 3 ?Skin: warm, dry ?Psych: affect normal ?Pelvic exam: Performed: Cervix pink, visually closed, without lesion, scant white creamy discharge, vaginal walls and external genitalia  normal ?Bimanual exam: Cervix 0/long/high, firm, anterior, neg CMT, uterus nontender, nonenlarged, adnexa without tenderness, enlargement, or mass  ? ? ?   ?Assessment/Plan:  ? ?1. Routine screening for STI (sexually transmitted infection) ?--Pt declines serum testing, discussed recommended testing, pt may schedule at another date ?- Cervicovaginal ancillary only( [place]) ? ?2. Cervical cancer screening ? ?- Cytology - PAP( [place]) ? ?3. Encounter for counseling regarding contraception ?--Pt had IUD out due to frequent bleeding not resolved by OCPs ?-Discussed pt contraceptive plans and reviewed contraceptive methods based on pt preferences and effectiveness.  Pt is not currently sexually active.  Pt to make appt, virtual or in person, for contraceptive visit PRN.   ? ?4. Well woman exam with routine gynecological exam ?--Overall doing well, no gyn concerns  ? ? ?Return in about 1 year (around 09/29/2022) for annual exam.  ? ?Fatima Blank, CNM ?10:06 AM   ?

## 2021-09-28 NOTE — Progress Notes (Signed)
Pt presents for annual, 1st pap, and vaginal STD testing only. ?Patient prefers condoms for birth control ?PHQ9=1 ?GAD7=1  ? ?

## 2021-09-28 NOTE — Patient Instructions (Signed)
For birth control information, go to [email or web address] ? ? ?

## 2021-09-29 LAB — CERVICOVAGINAL ANCILLARY ONLY
Chlamydia: NEGATIVE
Comment: NEGATIVE
Comment: NEGATIVE
Comment: NORMAL
Neisseria Gonorrhea: NEGATIVE
Trichomonas: NEGATIVE

## 2021-09-30 LAB — CYTOLOGY - PAP
Comment: NEGATIVE
Diagnosis: UNDETERMINED — AB
High risk HPV: POSITIVE — AB

## 2021-10-01 ENCOUNTER — Encounter: Payer: Self-pay | Admitting: Advanced Practice Midwife

## 2021-11-03 ENCOUNTER — Ambulatory Visit
Admission: RE | Admit: 2021-11-03 | Discharge: 2021-11-03 | Disposition: A | Discharge status: Home / Self Care | Payer: Medicaid Other | Source: Ambulatory Visit | Attending: Student | Admitting: Student

## 2021-11-03 VITALS — BP 96/70 | HR 85 | Temp 98.7°F | Resp 17

## 2021-11-03 DIAGNOSIS — A084 Viral intestinal infection, unspecified: Secondary | ICD-10-CM

## 2021-11-03 LAB — POCT URINALYSIS DIP (MANUAL ENTRY)
Bilirubin, UA: NEGATIVE
Blood, UA: NEGATIVE
Glucose, UA: NEGATIVE mg/dL
Ketones, POC UA: NEGATIVE mg/dL
Nitrite, UA: NEGATIVE
Protein Ur, POC: NEGATIVE mg/dL
Spec Grav, UA: 1.025 (ref 1.010–1.025)
Urobilinogen, UA: 1 E.U./dL
pH, UA: 7 (ref 5.0–8.0)

## 2021-11-03 LAB — POCT URINE PREGNANCY: Preg Test, Ur: NEGATIVE

## 2021-11-03 MED ORDER — ONDANSETRON 4 MG PO TBDP: 4.0000 mg | ORAL_TABLET | Freq: Three times a day (TID) | ORAL | 0 refills | Status: DC | PRN

## 2021-11-03 NOTE — ED Triage Notes (Signed)
Pt presents with lower abdominal pain the past few days.

## 2021-11-03 NOTE — Discharge Instructions (Addendum)
-  Take the Zofran (ondansetron) up to 3 times daily for nausea and vomiting. Dissolve one pill under your tongue or between your teeth and your cheek. -You can purchase Imodium (loperamide) over-the-counter. Take the Imodium (loperamide) up to 4 times daily for diarrhea. -Drink plenty of fluids and eat a bland diet

## 2021-11-03 NOTE — ED Provider Notes (Signed)
[name]    CSN: [id number] Arrival date & time: 11/03/21  0943      History   Chief Complaint Chief Complaint  Patient presents with   Abdominal Pain    Entered by patient    HPI Rachael Cook is a 21 y.o. female presenting with abdominal pain for 2 days.  History gonorrhea, chlamydia.  She describes crampy lower abdominal pain, diarrhea, nausea without vomiting.  She did recently eat out at a [employer], but denies known sick contacts.  She is tolerating fluids and food.  Has not attempted interventions at home.  Needs a note for work.  Denies vaginal or urinary symptoms.  HPI  Past Medical History:  Diagnosis Date   Sickle cell trait [facility])     Patient Active Problem List   Diagnosis Date Noted   Closed displaced fracture of medial malleolus of right tibia    Gonorrhea in female 02/01/2019   IUD (intrauterine device) in place 09/25/2018   Chlamydia infection 04/09/2018    Past Surgical History:  Procedure Laterality Date   ORIF ANKLE FRACTURE Right 01/30/2020   Procedure: OPEN REDUCTION INTERNAL FIXATION (ORIF) RIGHT ANKLE FRACTURE;  Surgeon: Nadara Mustard, MD;  Location: [facility] OR;  Service: Orthopedics;  Laterality: Right;   WISDOM TOOTH EXTRACTION      OB History     Gravida  0   Para  0   Term  0   Preterm  0   AB  0   Living  0      SAB  0   IAB  0   Ectopic  0   Multiple  0   Live Births  0            Home Medications    Prior to Admission medications   Medication Sig Start Date End Date Taking? Authorizing Provider  ondansetron (ZOFRAN-ODT) 4 MG disintegrating tablet Take 1 tablet (4 mg total) by mouth every 8 (eight) hours as needed for nausea or vomiting. 11/03/21  Yes Rhys Martini, PA-C    Family History Family History  Problem Relation Age of Onset   Cancer - Colon Maternal Grandmother     Social History Social History   Tobacco Use   Smoking status: Never    Passive exposure: Never    Smokeless tobacco: Never  Vaping Use   Vaping Use: Never used  Substance Use Topics   Alcohol use: No   Drug use: No     Allergies   Patient has no known allergies.   Review of Systems Review of Systems  Constitutional:  Negative for appetite change, chills and fever.  HENT:  Negative for congestion, ear pain, rhinorrhea, sinus pressure, sinus pain and sore throat.   Eyes:  Negative for redness and visual disturbance.  Respiratory:  Negative for cough, chest tightness, shortness of breath and wheezing.   Cardiovascular:  Negative for chest pain and palpitations.  Gastrointestinal:  Positive for diarrhea, nausea and vomiting. Negative for abdominal pain and constipation.  Genitourinary:  Negative for dysuria, frequency and urgency.  Musculoskeletal:  Negative for myalgias.  Neurological:  Negative for dizziness, weakness and headaches.  Psychiatric/Behavioral:  Negative for confusion.   All other systems reviewed and are negative.   Physical Exam Triage Vital Signs ED Triage Vitals  Enc Vitals Group     BP 11/03/21 1003 96/70     Pulse Rate 11/03/21 1003 85     Resp 11/03/21 1003 17  Temp 11/03/21 1003 98.7 F (37.1 C)     Temp Source 11/03/21 1003 Oral     SpO2 11/03/21 1003 99 %     Weight --      Height --      Head Circumference --      Peak Flow --      Pain Score 11/03/21 1006 5     Pain Loc --      Pain Edu? --      Excl. in GC? --    No data found.  Updated Vital Signs BP 96/70 (BP Location: Left Arm)   Pulse 85   Temp 98.7 F (37.1 C) (Oral)   Resp 17   LMP 10/27/2021   SpO2 99%   Visual Acuity Right Eye Distance:   Left Eye Distance:   Bilateral Distance:    Right Eye Near:   Left Eye Near:    Bilateral Near:     Physical Exam Vitals reviewed.  Constitutional:      General: She is not in acute distress.    Appearance: Normal appearance. She is not ill-appearing.  HENT:     Head: Normocephalic and atraumatic.     Mouth/Throat:      Mouth: Mucous membranes are moist.     Comments: Moist mucous membranes Eyes:     Extraocular Movements: Extraocular movements intact.     Pupils: Pupils are equal, round, and reactive to light.  Cardiovascular:     Rate and Rhythm: Normal rate and regular rhythm.     Heart sounds: Normal heart sounds.  Pulmonary:     Effort: Pulmonary effort is normal.     Breath sounds: Normal breath sounds. No wheezing, rhonchi or rales.  Abdominal:     General: Bowel sounds are normal. There is no distension.     Palpations: Abdomen is soft. There is no mass.     Tenderness: There is abdominal tenderness. There is no right CVA tenderness, left CVA tenderness, guarding or rebound.     Comments: Minimal lower abd pain to palpation, without guarding or rebound. Comfortable throughout exam. No RUQ, RLQ, or LUQ tenderness.   Skin:    General: Skin is warm.     Capillary Refill: Capillary refill takes less than 2 seconds.     Comments: Good skin turgor  Neurological:     General: No focal deficit present.     Mental Status: She is alert and oriented to person, place, and time.  Psychiatric:        Mood and Affect: Mood normal.        Behavior: Behavior normal.     UC Treatments / Results  Labs (all labs ordered are listed, but only abnormal results are displayed) Labs Reviewed  POCT URINALYSIS DIP (MANUAL ENTRY) - Abnormal; Notable for the following components:      Result Value   Leukocytes, UA Trace (*)    All other components within normal limits  POCT URINE PREGNANCY    EKG   Radiology No results found.  Procedures Procedures (including critical care time)  Medications Ordered in UC Medications - No data to display  Initial Impression / Assessment and Plan / UC Course  I have reviewed the triage vital signs and the nursing notes.  Pertinent labs & imaging results that were available during my care of the patient were reviewed by me and considered in my medical decision  making (see chart for details).     This patient is  a very pleasant 21 y.o. year old female presenting with viral gastroenteritis. Afebrile, nontachycardic, appears well hydrated. Nausea without vomiting, tolerating fluids PO.  UA wnl, did not send culture.  U-preg negative.  Will manage with zofran, imodium, good hydration, BRAT diet. Work note provided. ED return precautions discussed. Patient verbalizes understanding and agreement.    Final Clinical Impressions(s) / UC Diagnoses   Final diagnoses:  Viral gastroenteritis     Discharge Instructions      -Take the Zofran (ondansetron) up to 3 times daily for nausea and vomiting. Dissolve one pill under your tongue or between your teeth and your cheek. -You can purchase Imodium (loperamide) over-the-counter. Take the Imodium (loperamide) up to 4 times daily for diarrhea. -Drink plenty of fluids and eat a bland diet    ED Prescriptions     Medication Sig Dispense Auth. Provider   ondansetron (ZOFRAN-ODT) 4 MG disintegrating tablet Take 1 tablet (4 mg total) by mouth every 8 (eight) hours as needed for nausea or vomiting. 21 tablet Rhys MartiniGraham, Karren Newland E, PA-C      PDMP not reviewed this encounter.   Rhys MartiniGraham, Valkyrie Guardiola E, PA-C 11/03/21 1047

## 2021-12-11 ENCOUNTER — Ambulatory Visit: Payer: Self-pay

## 2021-12-21 ENCOUNTER — Ambulatory Visit
Admission: RE | Admit: 2021-12-21 | Discharge: 2021-12-21 | Disposition: A | Discharge status: Home / Self Care | Payer: Medicaid Other | Source: Ambulatory Visit | Attending: Internal Medicine | Admitting: Internal Medicine

## 2021-12-21 VITALS — BP 109/74 | HR 97 | Temp 98.8°F | Resp 18

## 2021-12-21 DIAGNOSIS — Z3201 Encounter for pregnancy test, result positive: Secondary | ICD-10-CM | POA: Diagnosis not present

## 2021-12-21 DIAGNOSIS — N644 Mastodynia: Secondary | ICD-10-CM

## 2021-12-21 LAB — POCT URINE PREGNANCY: Preg Test, Ur: POSITIVE — AB

## 2021-12-21 NOTE — ED Provider Notes (Signed)
[name]    CSN: [id number] Arrival date & time: 12/21/21  1649      History   Chief Complaint Chief Complaint  Patient presents with   Breast Problem    They been hurting for about a month in a half - Entered by patient    HPI Rachael Cook is a 21 y.o. female.   Patient presents with bilateral breast tenderness, missed menstrual cycle, mild lower abdominal cramping that has been present intermittently for the past month.  Patient reports that she had an negative and a positive pregnancy test at home about a week ago.  Last menstrual cycle was 11/21/2021.  Patient reports abdominal pain is cramping and is very mild in nature.  It is also intermittent.  Breast tenderness is bilateral and she denies any obvious lumps or masses.  Denies any nipple discharge.  Denies vaginal discharge, dysuria, urinary frequency, back pain, fever.     Past Medical History:  Diagnosis Date   Sickle cell trait [facility])     Patient Active Problem List   Diagnosis Date Noted   Closed displaced fracture of medial malleolus of right tibia    Gonorrhea in female 02/01/2019   IUD (intrauterine device) in place 09/25/2018   Chlamydia infection 04/09/2018    Past Surgical History:  Procedure Laterality Date   ORIF ANKLE FRACTURE Right 01/30/2020   Procedure: OPEN REDUCTION INTERNAL FIXATION (ORIF) RIGHT ANKLE FRACTURE;  Surgeon: Nadara Mustard, MD;  Location: [facility] OR;  Service: Orthopedics;  Laterality: Right;   WISDOM TOOTH EXTRACTION      OB History     Gravida  0   Para  0   Term  0   Preterm  0   AB  0   Living  0      SAB  0   IAB  0   Ectopic  0   Multiple  0   Live Births  0            Home Medications    Prior to Admission medications   Medication Sig Start Date End Date Taking? Authorizing Provider  ondansetron (ZOFRAN-ODT) 4 MG disintegrating tablet Take 1 tablet (4 mg total) by mouth every 8 (eight) hours as needed for nausea or vomiting.  11/03/21   Rhys Martini, PA-C    Family History Family History  Problem Relation Age of Onset   Cancer - Colon Maternal Grandmother     Social History Social History   Tobacco Use   Smoking status: Never    Passive exposure: Never   Smokeless tobacco: Never  Vaping Use   Vaping Use: Never used  Substance Use Topics   Alcohol use: No   Drug use: No     Allergies   Patient has no known allergies.   Review of Systems Review of Systems Per HPI  Physical Exam Triage Vital Signs ED Triage Vitals  Enc Vitals Group     BP 12/21/21 1758 109/74     Pulse Rate 12/21/21 1758 97     Resp 12/21/21 1758 18     Temp 12/21/21 1758 98.8 F (37.1 C)     Temp src --      SpO2 12/21/21 1758 98 %     Weight --      Height --      Head Circumference --      Peak Flow --      Pain Score 12/21/21 1757 7  Pain Loc --      Pain Edu? --      Excl. in GC? --    No data found.  Updated Vital Signs BP 109/74   Pulse 97   Temp 98.8 F (37.1 C)   Resp 18   LMP  (LMP Unknown)   SpO2 98%   Visual Acuity Right Eye Distance:   Left Eye Distance:   Bilateral Distance:    Right Eye Near:   Left Eye Near:    Bilateral Near:     Physical Exam Constitutional:      General: She is not in acute distress.    Appearance: Normal appearance. She is not toxic-appearing or diaphoretic.  HENT:     Head: Normocephalic and atraumatic.  Eyes:     Extraocular Movements: Extraocular movements intact.     Conjunctiva/sclera: Conjunctivae normal.  Pulmonary:     Effort: Pulmonary effort is normal.  Neurological:     General: No focal deficit present.     Mental Status: She is alert and oriented to person, place, and time. Mental status is at baseline.  Psychiatric:        Mood and Affect: Mood normal.        Behavior: Behavior normal.        Thought Content: Thought content normal.        Judgment: Judgment normal.      UC Treatments / Results  Labs (all labs ordered are  listed, but only abnormal results are displayed) Labs Reviewed  POCT URINE PREGNANCY - Abnormal; Notable for the following components:      Result Value   Preg Test, Ur Positive (*)    All other components within normal limits    EKG   Radiology No results found.  Procedures Procedures (including critical care time)  Medications Ordered in UC Medications - No data to display  Initial Impression / Assessment and Plan / UC Course  I have reviewed the triage vital signs and the nursing notes.  Pertinent labs & imaging results that were available during my care of the patient were reviewed by me and considered in my medical decision making (see chart for details).     Urine pregnancy test was positive.  Suspect patient's symptoms of lower abdominal cramping and bilateral breast tenderness are related to early pregnancy symptoms.  Abdominal cramping is mild and I suspect it is related to early pregnancy as opposed to pregnancy complication.  Patient does not have any abnormal bleeding so do not think that emergent evaluation is necessary at the hospital.  Patient has OB/GYN appointment on August 8 so encouraged patient to keep this appointment for further evaluation and management.  Also advised patient to start taking prenatal vitamin.  Patient was advised to go to the ER if pain worsens or if she develops abnormal vaginal bleeding.  Patient verbalized understanding and was agreeable with plan. Final Clinical Impressions(s) / UC Diagnoses   Final diagnoses:  Positive urine pregnancy test  Breast tenderness     Discharge Instructions      Pregnancy test was positive.  Suspect that your symptoms are related to early pregnancy.  Recommend starting to take a prenatal vitamin that you can get at the pharmacy or [facility].  Keep scheduled appointment with OBGYN for further evaluation and management.  Please go to the ER if abdominal pain worsens or you develop abnormal vaginal bleeding,  vaginal discharge, back pain.    ED Prescriptions   None  PDMP not reviewed this encounter.   Gustavus Bryant, [place] 12/21/21 1815

## 2021-12-21 NOTE — Discharge Instructions (Signed)
Pregnancy test was positive.  Suspect that your symptoms are related to early pregnancy.  Recommend starting to take a prenatal vitamin that you can get at the pharmacy or [facility].  Keep scheduled appointment with OBGYN for further evaluation and management.  Please go to the ER if abdominal pain worsens or you develop abnormal vaginal bleeding, vaginal discharge, back pain.

## 2021-12-21 NOTE — ED Triage Notes (Signed)
Pt presents with 7/10 bilateral breast pain and missed period, and slight cramping. Pt has obgyn appointment in august. Pt st she had one neg and one pos pregnancy test

## 2022-01-01 ENCOUNTER — Ambulatory Visit
Admission: RE | Admit: 2022-01-01 | Discharge: 2022-01-01 | Disposition: A | Discharge status: Home / Self Care | Payer: Medicaid Other | Source: Ambulatory Visit | Attending: Physician Assistant | Admitting: Physician Assistant

## 2022-01-01 VITALS — BP 103/64 | HR 105 | Temp 98.0°F | Resp 18

## 2022-01-01 DIAGNOSIS — N898 Other specified noninflammatory disorders of vagina: Secondary | ICD-10-CM | POA: Diagnosis not present

## 2022-01-01 DIAGNOSIS — Z3A01 Less than 8 weeks gestation of pregnancy: Secondary | ICD-10-CM | POA: Diagnosis not present

## 2022-01-01 NOTE — ED Provider Notes (Signed)
[name]    CSN: [id number] Arrival date & time: 01/01/22  1354      History   Chief Complaint Chief Complaint  Patient presents with   Vaginal Discharge    Smell a little funny and I'm pregnant - Entered by patient    HPI Rachael Cook is a 21 y.o. female.   Patient here today for evaluation of vaginal discharge that she reports is somewhat malodorous She is currently 5 weeks 5/7 days pregnant. She has appointment with OB in the upcoming weeks but has not established care with same yet. She does not report any other symptoms or concerns other than typical morning sickness.  The history is provided by the patient.  Vaginal Discharge Associated symptoms: nausea   Associated symptoms: no abdominal pain and no fever     Past Medical History:  Diagnosis Date   Sickle cell trait [facility])     Patient Active Problem List   Diagnosis Date Noted   Closed displaced fracture of medial malleolus of right tibia    Gonorrhea in female 02/01/2019   IUD (intrauterine device) in place 09/25/2018   Chlamydia infection 04/09/2018    Past Surgical History:  Procedure Laterality Date   ORIF ANKLE FRACTURE Right 01/30/2020   Procedure: OPEN REDUCTION INTERNAL FIXATION (ORIF) RIGHT ANKLE FRACTURE;  Surgeon: Nadara Mustard, MD;  Location: [facility] OR;  Service: Orthopedics;  Laterality: Right;   WISDOM TOOTH EXTRACTION      OB History     Gravida  1   Para  0   Term  0   Preterm  0   AB  0   Living  0      SAB  0   IAB  0   Ectopic  0   Multiple  0   Live Births  0            Home Medications    Prior to Admission medications   Medication Sig Start Date End Date Taking? Authorizing Provider  ondansetron (ZOFRAN-ODT) 4 MG disintegrating tablet Take 1 tablet (4 mg total) by mouth every 8 (eight) hours as needed for nausea or vomiting. 11/03/21   Rhys Martini, PA-C    Family History Family History  Problem Relation Age of Onset   Cancer -  Colon Maternal Grandmother     Social History Social History   Tobacco Use   Smoking status: Never    Passive exposure: Never   Smokeless tobacco: Never  Vaping Use   Vaping Use: Never used  Substance Use Topics   Alcohol use: No   Drug use: No     Allergies   Patient has no known allergies.   Review of Systems Review of Systems  Constitutional:  Negative for chills and fever.  Eyes:  Negative for discharge and redness.  Gastrointestinal:  Positive for nausea. Negative for abdominal pain.  Genitourinary:  Positive for vaginal discharge.     Physical Exam Triage Vital Signs ED Triage Vitals  Enc Vitals Group     BP      Pulse      Resp      Temp      Temp src      SpO2      Weight      Height      Head Circumference      Peak Flow      Pain Score      Pain Loc  Pain Edu?      Excl. in GC?    No data found.  Updated Vital Signs BP 103/64 (BP Location: Left Arm)   Pulse (!) 105   Temp 98 F (36.7 C) (Oral)   Resp 18   LMP  (LMP Unknown)   SpO2 98%      Physical Exam Vitals and nursing note reviewed.  Constitutional:      General: She is not in acute distress.    Appearance: Normal appearance. She is not ill-appearing.  HENT:     Head: Normocephalic and atraumatic.  Eyes:     Conjunctiva/sclera: Conjunctivae normal.  Cardiovascular:     Rate and Rhythm: Normal rate.  Pulmonary:     Effort: Pulmonary effort is normal.  Neurological:     Mental Status: She is alert.  Psychiatric:        Mood and Affect: Mood normal.        Behavior: Behavior normal.        Thought Content: Thought content normal.      UC Treatments / Results  Labs (all labs ordered are listed, but only abnormal results are displayed) Labs Reviewed  CERVICOVAGINAL ANCILLARY ONLY    EKG   Radiology No results found.  Procedures Procedures (including critical care time)  Medications Ordered in UC Medications - No data to display  Initial Impression /  Assessment and Plan / UC Course  I have reviewed the triage vital signs and the nursing notes.  Pertinent labs & imaging results that were available during my care of the patient were reviewed by me and considered in my medical decision making (see chart for details).    Will order STD screening as well as screening for BV/ yeast. Will await results for further recommendation. Encouraged preggie pops/ b vitamins, unisom/ ginger etc for morning sickness if needed and to discuss same with OB when she establishes care.   Final Clinical Impressions(s) / UC Diagnoses   Final diagnoses:  Vaginal discharge  Less than [redacted] weeks gestation of pregnancy   Discharge Instructions   None    ED Prescriptions   None    PDMP not reviewed this encounter.   Tomi Bamberger, PA-C 01/01/22 1418

## 2022-01-01 NOTE — ED Triage Notes (Signed)
Pt c/o vaginal odor and discharge x 1 week also wants to discuss options for morning sickness

## 2022-01-04 ENCOUNTER — Other Ambulatory Visit: Payer: Self-pay | Admitting: *Deleted

## 2022-01-04 LAB — CERVICOVAGINAL ANCILLARY ONLY
Bacterial Vaginitis (gardnerella): POSITIVE — AB
Candida Glabrata: NEGATIVE
Candida Vaginitis: NEGATIVE
Chlamydia: NEGATIVE
Comment: NEGATIVE
Comment: NEGATIVE
Comment: NEGATIVE
Comment: NEGATIVE
Comment: NEGATIVE
Comment: NORMAL
Neisseria Gonorrhea: NEGATIVE
Trichomonas: NEGATIVE

## 2022-01-04 MED ORDER — PROMETHAZINE HCL 25 MG PO TABS: 25.0000 mg | ORAL_TABLET | Freq: Four times a day (QID) | ORAL | 0 refills | Status: DC | PRN

## 2022-01-04 NOTE — Progress Notes (Signed)
Phenergan sent today per protocol for N&V in pregnancy.

## 2022-01-05 ENCOUNTER — Telehealth (HOSPITAL_COMMUNITY): Payer: Self-pay | Admitting: Emergency Medicine

## 2022-01-05 MED ORDER — METRONIDAZOLE 0.75 % VA GEL: 1.0000 | Freq: Every day | VAGINAL | 0 refills | Status: AC

## 2022-01-06 ENCOUNTER — Ambulatory Visit: Payer: Medicaid Other | Admitting: Obstetrics and Gynecology

## 2022-01-10 ENCOUNTER — Other Ambulatory Visit: Payer: Self-pay

## 2022-01-10 ENCOUNTER — Emergency Department (HOSPITAL_COMMUNITY): Payer: Medicaid Other

## 2022-01-10 ENCOUNTER — Emergency Department (HOSPITAL_COMMUNITY)
Admission: EM | Admit: 2022-01-10 | Discharge: 2022-01-10 | Disposition: A | Discharge status: Home / Self Care | Payer: Medicaid Other | Attending: Emergency Medicine | Admitting: Emergency Medicine

## 2022-01-10 ENCOUNTER — Encounter (HOSPITAL_COMMUNITY): Payer: Self-pay

## 2022-01-10 DIAGNOSIS — O26891 Other specified pregnancy related conditions, first trimester: Secondary | ICD-10-CM | POA: Diagnosis present

## 2022-01-10 DIAGNOSIS — O034 Incomplete spontaneous abortion without complication: Secondary | ICD-10-CM | POA: Diagnosis not present

## 2022-01-10 DIAGNOSIS — N9489 Other specified conditions associated with female genital organs and menstrual cycle: Secondary | ICD-10-CM | POA: Diagnosis not present

## 2022-01-10 DIAGNOSIS — O200 Threatened abortion: Secondary | ICD-10-CM | POA: Insufficient documentation

## 2022-01-10 DIAGNOSIS — R1030 Lower abdominal pain, unspecified: Secondary | ICD-10-CM | POA: Diagnosis not present

## 2022-01-10 DIAGNOSIS — Z3A01 Less than 8 weeks gestation of pregnancy: Secondary | ICD-10-CM | POA: Diagnosis not present

## 2022-01-10 LAB — URINALYSIS, ROUTINE W REFLEX MICROSCOPIC
Bilirubin Urine: NEGATIVE
Glucose, UA: NEGATIVE mg/dL
Ketones, ur: 5 mg/dL — AB
Nitrite: NEGATIVE
Protein, ur: NEGATIVE mg/dL
Specific Gravity, Urine: 1.015 (ref 1.005–1.030)
WBC, UA: >50 WBC/hpf — ABNORMAL HIGH (ref 0–5)
pH: 7 (ref 5.0–8.0)

## 2022-01-10 LAB — CBC WITH DIFFERENTIAL/PLATELET
Abs Immature Granulocytes: 0.08 10*3/uL — ABNORMAL HIGH (ref 0.00–0.07)
Basophils Absolute: 0 10*3/uL (ref 0.0–0.1)
Basophils Relative: 0 %
Eosinophils Absolute: 0.1 10*3/uL (ref 0.0–0.5)
Eosinophils Relative: 1 %
HCT: 35.6 % — ABNORMAL LOW (ref 36.0–46.0)
Hemoglobin: 12.5 g/dL (ref 12.0–15.0)
Immature Granulocytes: 1 %
Lymphocytes Relative: 6 %
Lymphs Abs: 1.1 10*3/uL (ref 0.7–4.0)
MCH: 30.2 pg (ref 26.0–34.0)
MCHC: 35.1 g/dL (ref 30.0–36.0)
MCV: 86 fL (ref 80.0–100.0)
Monocytes Absolute: 0.9 10*3/uL (ref 0.1–1.0)
Monocytes Relative: 5 %
Neutro Abs: 15.5 10*3/uL — ABNORMAL HIGH (ref 1.7–7.7)
Neutrophils Relative %: 87 %
Platelets: 258 10*3/uL (ref 150–400)
RBC: 4.14 MIL/uL (ref 3.87–5.11)
RDW: 12.3 % (ref 11.5–15.5)
WBC: 17.7 10*3/uL — ABNORMAL HIGH (ref 4.0–10.5)
nRBC: 0 % (ref 0.0–0.2)

## 2022-01-10 LAB — COMPREHENSIVE METABOLIC PANEL
ALT: 10 U/L (ref 0–44)
AST: 15 U/L (ref 15–41)
Albumin: 4.3 g/dL (ref 3.5–5.0)
Alkaline Phosphatase: 45 U/L (ref 38–126)
Anion gap: 11 (ref 5–15)
BUN: 6 mg/dL (ref 6–20)
CO2: 22 mmol/L (ref 22–32)
Calcium: 9.9 mg/dL (ref 8.9–10.3)
Chloride: 102 mmol/L (ref 98–111)
Creatinine, Ser: 0.77 mg/dL (ref 0.44–1.00)
GFR, Estimated: >60 mL/min (ref >60)
Glucose, Bld: 98 mg/dL (ref 70–99)
Potassium: 3.8 mmol/L (ref 3.5–5.1)
Sodium: 135 mmol/L (ref 135–145)
Total Bilirubin: 0.8 mg/dL (ref 0.3–1.2)
Total Protein: 7.6 g/dL (ref 6.5–8.1)

## 2022-01-10 LAB — HCG, QUANTITATIVE, PREGNANCY: hCG, Beta Chain, Quant, S: 84959 m[IU]/mL — ABNORMAL HIGH (ref <5)

## 2022-01-10 LAB — LIPASE, BLOOD: Lipase: 21 U/L (ref 11–51)

## 2022-01-10 NOTE — ED Provider Notes (Signed)
[facility]-EMERGENCY DEPT Provider Note   CSN: [id number] Arrival date & time: 01/10/22  0856     History Chief Complaint  Patient presents with   Abdominal Cramping    Rajah Lamba is a 21 y.o. female G1P0Ab0 patient who is currently approximately between 8 and [redacted] weeks pregnant who presents to the emergency department today with lower abdominal pain.  This started yesterday.  Abdominal pain is poorly characterized.  She has not yet seen an OB nor had an ultrasound.  She denies any vaginal bleeding, discharge, or change in vaginal fluid.  She reports associated nausea which she attributes to morning sickness and denies vomiting or diarrhea.  She also denies urinary complaints. Abdominal pain does not radiate.    Abdominal Cramping       Home Medications Prior to Admission medications   Medication Sig Start Date End Date Taking? Authorizing Provider  metroNIDAZOLE (METROGEL VAGINAL) 0.75 % vaginal gel Place 1 Applicatorful vaginally at bedtime for 5 days. 01/05/22 01/10/22 Yes Lamptey, Britta Mccreedy, MD  Prenatal Vit-Fe Fumarate-FA (PRENATAL MULTIVITAMIN) TABS tablet Take 2 tablets by mouth daily at 12 noon.   Yes [provider]  promethazine (PHENERGAN) 25 MG tablet Take 1 tablet (25 mg total) by mouth every 6 (six) hours as needed for nausea. 01/04/22  Yes Hermina Staggers, MD  ondansetron (ZOFRAN-ODT) 4 MG disintegrating tablet Take 1 tablet (4 mg total) by mouth every 8 (eight) hours as needed for nausea or vomiting. Patient not taking: Reported on 01/10/2022 11/03/21   Rhys Martini, PA-C      Allergies    Patient has no known allergies.    Review of Systems   Review of Systems  Physical Exam Updated Vital Signs BP 94/65   Pulse (!) 101   Temp 98.5 F (36.9 C) (Oral)   Resp 18   Ht 5\' 7"  (1.702 m)   Wt 54.4 kg   LMP  (LMP Unknown)   SpO2 100%   BMI 18.79 kg/m  Physical Exam  ED Results / Procedures / Treatments   Labs (all  labs ordered are listed, but only abnormal results are displayed) Labs Reviewed  CBC WITH DIFFERENTIAL/PLATELET - Abnormal; Notable for the following components:      Result Value   WBC 17.7 (*)    HCT 35.6 (*)    Neutro Abs 15.5 (*)    Abs Immature Granulocytes 0.08 (*)    All other components within normal limits  URINALYSIS, ROUTINE W REFLEX MICROSCOPIC - Abnormal; Notable for the following components:   APPearance CLOUDY (*)    Hgb urine dipstick MODERATE (*)    Ketones, ur 5 (*)    Leukocytes,Ua LARGE (*)    WBC, UA >50 (*)    Bacteria, UA RARE (*)    All other components within normal limits  HCG, QUANTITATIVE, PREGNANCY - Abnormal; Notable for the following components:   hCG, Beta Chain, Quant, S 84,959 (*)    All other components within normal limits  URINE CULTURE  COMPREHENSIVE METABOLIC PANEL  LIPASE, BLOOD    EKG None  Radiology OB Comp Less 14 Wks  Result Date: 01/10/2022 CLINICAL DATA:  21 year old pregnant female with pelvic pain. EXAM: OBSTETRIC <14 WK ULTRASOUND TECHNIQUE: Transabdominal ultrasound was performed for evaluation of the gestation as well as the maternal uterus and adnexal regions. COMPARISON:  None Available. FINDINGS: Intrauterine gestational sac: Single Yolk sac:  Visualized. Embryo:  Visualized. Cardiac Activity: Visualized. Heart Rate: 147 bpm  CRL:   10.2 mm   7 w 1 d                  [place] EDC: 08/28/2022 Subchorionic hemorrhage:  None visualized. Maternal uterus/adnexae: No significant ovarian abnormalities noted. No free fluid or adnexal mass. IMPRESSION: Single living intrauterine gestation with estimated gestational age of [redacted] weeks 1 day. No evidence of subchorionic hemorrhage. Electronically Signed   By: Harmon Pier M.D.   On: 01/10/2022 12:38    Procedures Procedures    Medications Ordered in ED Medications - No data to display  ED Course/ Medical Decision Making/ A&P Clinical Course as of 01/10/22 1457  Sun Jan 10, 2022  1308 CBC  with Differential(!) There is evidence of leukocytosis. [CF]  1309 Comprehensive metabolic panel Normal. [CF]  1309 Lipase, blood Normal. [CF]  1309 Urine Culture In process. [CF]  1309 hCG, quantitative, pregnancy(!) 84,000. [CF]  1309 Urinalysis, Routine w reflex microscopic Urine, Clean Catch(!) There is evidence of urinary tract infection in the setting of pregnancy.  We will plan to treat with antibiotics. [CF]  1309 On reevaluation, patient is now having some vaginal bleeding.  No evidence of clots or tissue.  We will observe for some time. [CF]  1309 US OB Comp Less 14 Wks I personally ordered and interpreted this study which shows IUP. [CF]  1446 On reevaluation, patient is having some bleeding still and she did pass a large clot and the toilet.  No evidence of other tissue or clots.  She bled through her first pad in 2 hours. [CF]    Clinical Course User Index [CF] Teressa Lower, PA-C                           Medical Decision Making Lanetra Hartley is a 21 y.o. female patient presents to the emergency department for further evaluation of lower abdominal pain.  Given that the patient is currently pregnant, I will get labs in addition to a OB ultrasound.  This is her first pregnancy.  Patient in no acute distress at this time.  Patient is now having vaginal bleeding.  Shared decision-making was done on whether to further watch versus going home.  Patient wishing to go home.  Thinks is reasonable.  I discussed red flag symptoms to return immediately.  Patient expressed full understanding.  She will call her OB tomorrow for prompt follow-up.  Patient is safe for discharge at this time.   Amount and/or Complexity of Data Reviewed Labs: ordered. Decision-making details documented in ED Course. Radiology: ordered. Decision-making details documented in ED Course.   Final Clinical Impression(s) / ED Diagnoses Final diagnoses:  Lower abdominal pain  Threatened  miscarriage in early pregnancy    Rx / DC Orders ED Discharge Orders     None         Teressa Lower, [place] 01/10/22 1458    Terrilee Files, MD 01/10/22 1735

## 2022-01-10 NOTE — ED Provider Triage Note (Signed)
Emergency Medicine Provider Triage Evaluation Note  Rachael Cook , a 21 y.o. female  was evaluated in triage.  Pt complains of lower abdominal pain that started yesterday.  Patient is a G1, P0 Ab0 patient who is currently roughly 8 to [redacted] weeks pregnant by dates.  She endorses nausea which she attributes to morning sickness but denies diarrhea, vomiting, vaginal discharge, vaginal bleeding, vaginal pain.  No fever or chills.  Review of Systems  Positive:  Negative: See above  Physical Exam  BP 103/67 (BP Location: Right Arm)   Pulse (!) 103   Temp 99.7 F (37.6 C) (Oral)   Resp 18   Ht 5\' 7"  (1.702 m)   Wt 54.4 kg   LMP  (LMP Unknown)   SpO2 95%   BMI 18.79 kg/m  Gen:   Awake, no distress   Resp:  Normal effort  MSK:   Moves extremities without difficulty Other:    Medical Decision Making  Medically screening exam initiated at 10:26 AM.  Appropriate orders placed.  Rachael Cook was informed that the remainder of the evaluation will be completed by another provider, this initial triage assessment does not replace that evaluation, and the importance of remaining in the ED until their evaluation is complete.     [place], [place] 01/10/22 1028

## 2022-01-10 NOTE — Discharge Instructions (Signed)
Please call your OB tomorrow to schedule an appointment.  Return to the emergency department sooner for any of the symptoms that we discussed today.

## 2022-01-10 NOTE — ED Triage Notes (Signed)
Pt reports lower abdominal cramping since yesterday. [redacted] weeks pregnant, denies vaginal bleeding.

## 2022-01-10 NOTE — ED Notes (Signed)
Patient has a urine culture in the main lab 

## 2022-01-11 ENCOUNTER — Inpatient Hospital Stay (HOSPITAL_COMMUNITY)
Admission: AD | Admit: 2022-01-11 | Discharge: 2022-01-11 | Disposition: A | Discharge status: Home / Self Care | Payer: Medicaid Other | Attending: Family Medicine | Admitting: Family Medicine

## 2022-01-11 ENCOUNTER — Telehealth: Payer: Self-pay | Admitting: *Deleted

## 2022-01-11 ENCOUNTER — Other Ambulatory Visit: Payer: Self-pay

## 2022-01-11 ENCOUNTER — Other Ambulatory Visit: Payer: Self-pay | Admitting: *Deleted

## 2022-01-11 ENCOUNTER — Inpatient Hospital Stay (HOSPITAL_COMMUNITY): Payer: Medicaid Other

## 2022-01-11 ENCOUNTER — Encounter (HOSPITAL_COMMUNITY): Payer: Self-pay | Admitting: Family Medicine

## 2022-01-11 DIAGNOSIS — O034 Incomplete spontaneous abortion without complication: Secondary | ICD-10-CM | POA: Diagnosis not present

## 2022-01-11 DIAGNOSIS — O209 Hemorrhage in early pregnancy, unspecified: Secondary | ICD-10-CM

## 2022-01-11 LAB — ABO/RH: ABO/RH(D): O POS

## 2022-01-11 LAB — HCG, QUANTITATIVE, PREGNANCY: hCG, Beta Chain, Quant, S: 21747 m[IU]/mL — ABNORMAL HIGH (ref <5)

## 2022-01-11 MED ORDER — OXYCODONE-ACETAMINOPHEN 7.5-325 MG PO TABS: 1.0000 | ORAL_TABLET | Freq: Four times a day (QID) | ORAL | 0 refills | Status: AC | PRN

## 2022-01-11 MED ORDER — MISOPROSTOL 200 MCG PO TABS: 200.0000 ug | ORAL_TABLET | Freq: Four times a day (QID) | ORAL | 1 refills | Status: DC

## 2022-01-11 NOTE — Telephone Encounter (Signed)
Returned TC to pt regarding f/u after ED visit for abdominal pain and VB. ED US showed viable IUP at 7.[redacted] wks GA. BHCG WNL for that GA. Discussed with Arbie Cookey, CNM. Recommends F/U US in 7-10 days. Ordered placed. Pt advised of need for [place] and that she would be called to schedule. Advised pt to seek care in MAU if she experiences heavy bleeding or worsening abdominal pain. Pt verbalized understanding and denies acute complaint at this time.

## 2022-01-11 NOTE — MAU Provider Note (Signed)
History     CSN: [id number]  Arrival date and time: 01/11/22 1357   None     Chief Complaint  Patient presents with   Abdominal Pain   Vaginal Bleeding   Rachael Cook is a 21 y.o. female G1P0 at [redacted]w[redacted]d by LMP and confirmed by U/S presenting today by the MAU for vaginal bleeding and cramping. She had an U/S yesterday which confirmed a viable [redacted]w[redacted]d fetus. She reports passing a golf ball sized clot after the U/S. She has not had any more bleeding since this clot. Mild cramping is still present.   Abdominal Pain The problem has been gradually improving since onset. The quality of the pain is described as cramping. Pertinent negatives include no hematuria.  Vaginal Bleeding The current episode started yesterday. The patient is experiencing no pain. She is pregnant. Associated symptoms include abdominal pain. Pertinent negatives include no hematuria.    OB History     Gravida  1   Para  0   Term  0   Preterm  0   AB  0   Living  0      SAB  0   IAB  0   Ectopic  0   Multiple  0   Live Births  0           Past Medical History:  Diagnosis Date   Sickle cell trait (HCC)     Past Surgical History:  Procedure Laterality Date   ORIF ANKLE FRACTURE Right 01/30/2020   Procedure: OPEN REDUCTION INTERNAL FIXATION (ORIF) RIGHT ANKLE FRACTURE;  Surgeon: Nadara Mustard, MD;  Location: [facility] OR;  Service: Orthopedics;  Laterality: Right;   WISDOM TOOTH EXTRACTION      Family History  Problem Relation Age of Onset   Cancer - Colon Maternal Grandmother     Social History   Tobacco Use   Smoking status: Never    Passive exposure: Never   Smokeless tobacco: Never  Vaping Use   Vaping Use: Never used  Substance Use Topics   Alcohol use: No   Drug use: No    Allergies: No Known Allergies  Medications Prior to Admission  Medication Sig Dispense Refill Last Dose   ondansetron (ZOFRAN-ODT) 4 MG disintegrating tablet Take 1 tablet (4 mg total) by mouth  every 8 (eight) hours as needed for nausea or vomiting. (Patient not taking: Reported on 01/10/2022) 21 tablet 0    Prenatal Vit-Fe Fumarate-FA (PRENATAL MULTIVITAMIN) TABS tablet Take 2 tablets by mouth daily at 12 noon.      promethazine (PHENERGAN) 25 MG tablet Take 1 tablet (25 mg total) by mouth every 6 (six) hours as needed for nausea. 30 tablet 0     Review of Systems  Gastrointestinal:  Positive for abdominal pain.  Genitourinary:  Positive for vaginal bleeding. Negative for hematuria.   Physical Exam   Blood pressure 110/68, pulse (!) 118, temperature 98.9 F (37.2 C), temperature source Oral, resp. rate 16, SpO2 96 %.  Physical Exam Constitutional:      Appearance: She is well-developed and normal weight.  Cardiovascular:     Rate and Rhythm: Normal rate.  Pulmonary:     Effort: Pulmonary effort is normal.  Abdominal:     General: Abdomen is flat.     Palpations: Abdomen is soft. There is no shifting dullness.  Skin:    General: Skin is warm and dry.  Neurological:     Mental Status: She is alert.   Bedside U/S:  uterus located, no gestational sac observed, no fetal tissue observed. Endometrial stripe clearly observed. Hyperechoic tissue seen in the previous location of the gestational sac. Concerning for missed abortion.   MAU Course  Procedures  MDM Rachael Cook is a 21 y.o. female G1P0 at [redacted]w[redacted]d by LMP and confirmed by U/S presenting today by the MAU for vaginal bleeding and cramping. She had an U/S yesterday which confirmed a viable [redacted]w[redacted]d fetus. She reports passing a golf ball sized clot after the U/S. She has not had any more bleeding since this clot. Quantitative HCG decreased 84,959 > 21,747. Bedside abdominal U/S showed no gestational sac. Follow up Transvaginal U/S ordered and report pending. Transvaginal U/S reviewed with Attending Dr. Adrian Blackwater, showed no gestational sac or fetal parts, no adnexal mass, findings consistent with incomplete abortion. Patient  counseled on Cytotec protocol for miscarriage.  She is instructed to follow up outpatient.  Patient sent home with 800 mg Cytotec and Percocet for pain control.  Assessment and Plan   1. Incomplete abortion   2. Vaginal bleeding affecting early pregnancy    Incomplete Abortion:  - counseled patient on miscarriage  - prescribed cytotec 400 mg, if no passage of tissue is observed in 24-48 hours a second dose is recommended and has been sent to her pharmacy.   - Percocet prescribed to help with pain control.  - follow up outpatient    Glendale Chard 01/11/2022, 2:48 PM

## 2022-01-11 NOTE — MAU Note (Signed)
Rachael Cook is a 22 y.o. at [redacted]w[redacted]d here in MAU reporting: Pt reports she was in the ED yesterday with abdominal cramping. Pt reports that she was given an u/s and everything was fine, but after the u/s she passed a golf ball sized clot. Pt reports she told the staff yesterday and they told her everything was okay. Pt reports she is having more abdominal cramping today. Pt reports her vaginal bleeding started yesterday during the visit, but has lightened up today.   Onset of complaint: yesterday  Pain score: 4/10 abdominal cramping  There were no vitals filed for this visit.    Lab orders placed from triage:  ua  Pt reports she was told she has a UTI yesterday.

## 2022-01-12 ENCOUNTER — Telehealth: Payer: Self-pay

## 2022-01-12 LAB — URINE CULTURE: Culture: 20000 — AB

## 2022-01-12 MED ORDER — MISOPROSTOL 200 MCG PO TABS: 800.0000 ug | ORAL_TABLET | Freq: Once | ORAL | 1 refills | Status: DC

## 2022-01-12 NOTE — Telephone Encounter (Signed)
Pharmacy calls nurse line in regards to Cytotec prescription sent in by [facility].   The prescription has two sets of directions.   Please update and resend.

## 2022-01-13 ENCOUNTER — Telehealth: Payer: Self-pay | Admitting: Emergency Medicine

## 2022-01-13 NOTE — Telephone Encounter (Signed)
Post ED Visit - Positive Culture Follow-up  Culture report reviewed by antimicrobial stewardship pharmacist: [facility] []  , Pharm.D. []  Enzo Bi, Pharm.D., BCPS AQ-ID []  , Pharm.D., BCPS []  Celedonio Miyamoto, Pharm.D., BCPS []  [place], Garvin Fila.D., BCPS, AAHIVP []  , Pharm.D., BCPS, AAHIVP []  Georgina Pillion, PharmD, BCPS []  , PharmD, BCPS []  [place], PharmD, BCPS []  [address], PharmD []  , PharmD, BCPS []  Estella Husk, PharmD  Pharmacy Team []  Lysle Pearl, PharmD []  , PharmD []  Phillips Climes, PharmD []  , Rph []  Agapito Games) , PharmD []  Verlan Friends, PharmD []  , PharmD []  Mervyn Gay, PharmD []  , PharmD []  Vinnie Level, PharmD []  [facility], PharmD []  , PharmD []  Len Childs, PharmD PharmD   Positive urine culture Treated with none, low colony count, insignificant growth, no further patient follow-up is required at this time.  Rachael Cook 01/13/2022, 10:28 AM

## 2022-01-13 NOTE — Progress Notes (Signed)
ED Antimicrobial Stewardship Positive Culture Follow Up   Rachael Cook is an 21 y.o. female who presented to [facility] on 01/10/2022 with a chief complaint of  Chief Complaint  Patient presents with   Abdominal Cramping    Recent Results (from the past 720 hour(s))  Urine Culture     Status: Abnormal   Collection Time: 01/10/22 11:46 AM   Specimen: Urine, Clean Catch  Result Value Ref Range Status   Specimen Description   Final    URINE, CLEAN CATCH Performed at [facility], [address] [place], [place] [address]    Special Requests   Final    NONE Performed at [facility], [address], [place], [place] [address]    Culture 20,000 COLONIES/mL STAPHYLOCOCCUS EPIDERMIDIS (A)  Final   Report Status 01/12/2022 FINAL  Final   Organism ID, Bacteria STAPHYLOCOCCUS EPIDERMIDIS (A)  Final      Susceptibility   Staphylococcus epidermidis - MIC*    CIPROFLOXACIN <=0.5 SENSITIVE Sensitive     GENTAMICIN <=0.5 SENSITIVE Sensitive     NITROFURANTOIN <=16 SENSITIVE Sensitive     OXACILLIN >=4 RESISTANT Resistant     TETRACYCLINE >=16 RESISTANT Resistant     VANCOMYCIN 2 SENSITIVE Sensitive     TRIMETH/SULFA 20 SENSITIVE Sensitive     CLINDAMYCIN 4 RESISTANT Resistant     RIFAMPIN <=0.5 SENSITIVE Sensitive     Inducible Clindamycin NEGATIVE Sensitive     * 20,000 COLONIES/mL STAPHYLOCOCCUS EPIDERMIDIS    20 YOF presented to ED on 01/10/22 w/ complanits of lower abdominal pain and nausea; no urinary symptoms were reported. At the time of this visit the patient was approx [redacted] wks pregnant. She is no longer pregnant as she miscarried during a hospital visit on 01/11/22. Patient was d/c'ed w/out abx.   UA yielded the following: Nitrite neg, leuko large, WBC >50, epithelial 0-5. Given that on the 14th the patient miscarried and was noted to be discharging blood clots vaginally it seems likely that the WBC/leuko detected in UA are the result of  blood contamination of the sample. UCx yielded 20,000 colonies/mL of staph epidermidis. Given the  low colony count, lack of urinary symptoms, and the presence of skin flora bacteria it is likely that this culture is the result of contamination. Her CBC showed elevation of WBC.   Spoke to provider and it was agreed that this patient does not require abx since her UCx and UA results are both likely d/t contamination, Her abdominal pain is explained by the complications she ultimately experienced w/ her former pregnancy, and she exhibited no urinary symptoms.  Plan - OK    ED Provider: Claude Manges   Zella Ball 01/13/2022, 9:09 AM PharmD Candidate  Monday - Friday phone -  [phone] Saturday - Sunday phone - [phone]

## 2022-01-26 ENCOUNTER — Ambulatory Visit: Payer: Self-pay

## 2022-02-11 ENCOUNTER — Encounter: Payer: Self-pay | Admitting: Obstetrics and Gynecology

## 2022-02-11 ENCOUNTER — Ambulatory Visit (INDEPENDENT_AMBULATORY_CARE_PROVIDER_SITE_OTHER): Payer: Medicaid Other | Admitting: Obstetrics and Gynecology

## 2022-02-11 VITALS — BP 107/68 | HR 87 | Ht 64.0 in | Wt 129.0 lb

## 2022-02-11 DIAGNOSIS — Z8759 Personal history of other complications of pregnancy, childbirth and the puerperium: Secondary | ICD-10-CM | POA: Diagnosis not present

## 2022-02-11 NOTE — Progress Notes (Signed)
  GYNECOLOGY PROGRESS NOTE  History:  Rachael Cook is a 21 y.o. G1P0000 presents to [facility] office today for follow-up SAB visit. She reports no period. She complains of her breasts continuing to be tender. Wanting to know if that is normal. She denies h/a, dizziness, shortness of breath, n/v, or fever/chills.    The following portions of the patient's history were reviewed and updated as appropriate: allergies, current medications, past family history, past medical history, past social history, past surgical history and problem list. Last pap smear on 09/28/2021 was abnormal (ASC-US), positive HRHPV.  Review of Systems:  Pertinent items are noted in HPI.   Objective:  Physical Exam Blood pressure 107/68, pulse 87, height 5\' 4"  (1.626 m), weight 129 lb (58.5 kg), unknown if currently breastfeeding. VS reviewed, nursing note reviewed,  Constitutional: well developed, well nourished, no distress HEENT: normocephalic CV: normal rate Pulm/chest wall: normal effort Breast Exam: deferred Abdomen: soft Neuro: alert and oriented x 3 Skin: warm, dry Psych: affect normal Pelvic exam: deferred  Assessment & Plan:  1. History of miscarriage - No f/u HCG since SAB on 01/11/2022 - Beta hCG quant (ref lab) - Encouraged to make sure condoms are being used correctly 100% of every sexual encounter - Patient verbalized an understanding of the plan of care and agrees.  01/13/2022, CNM 1:57 PM

## 2022-02-11 NOTE — Progress Notes (Signed)
21 y.o GYN presents for FU of SAB 01/11/2022.  Denies pain, bleeding, NV, chills, fever.  She is using condoms for [facility].  Last PAP 09/28/2021 ASCUS and +High risk HPV.

## 2022-02-12 LAB — BETA HCG QUANT (REF LAB): hCG Quant: 4 m[IU]/mL

## 2022-02-15 ENCOUNTER — Encounter: Payer: Self-pay | Admitting: Obstetrics and Gynecology

## 2022-02-17 ENCOUNTER — Ambulatory Visit: Admit: 2022-02-17 | Discharge status: Absent without leave | Payer: No Typology Code available for payment source

## 2022-02-17 ENCOUNTER — Ambulatory Visit (INDEPENDENT_AMBULATORY_CARE_PROVIDER_SITE_OTHER): Payer: Medicaid Other

## 2022-02-17 ENCOUNTER — Ambulatory Visit
Admission: EM | Admit: 2022-02-17 | Discharge: 2022-02-17 | Disposition: A | Discharge status: Home / Self Care | Payer: Medicaid Other | Attending: Internal Medicine | Admitting: Internal Medicine

## 2022-02-17 VITALS — BP 102/66 | HR 98 | Temp 98.4°F | Resp 16

## 2022-02-17 DIAGNOSIS — M79671 Pain in right foot: Secondary | ICD-10-CM

## 2022-02-17 NOTE — Discharge Instructions (Signed)
Your x-ray appears normal.  Recommend ice application and elevation of extremity.  Please follow-up with orthopedist for further evaluation and management.

## 2022-02-17 NOTE — ED Provider Notes (Signed)
[name]    CSN: [id number] Arrival date & time: 02/17/22  1349      History   Chief Complaint Chief Complaint  Patient presents with   Foot Pain    Entered by patient    HPI Rachael Cook is a 21 y.o. female.   Patient presents with right foot pain that started about 3 days ago.  Pain is mainly present in the heel and radiates up to the medial ankle.  Patient reports that she broke that ankle/foot approximately 2 years ago and had to have a surgical repair.  Denies any recent injuries.  Denies numbness or tingling.  Has not taken any medication for pain.   Foot Pain    Past Medical History:  Diagnosis Date   Sickle cell trait [facility])     Patient Active Problem List   Diagnosis Date Noted   Closed displaced fracture of medial malleolus of right tibia    Gonorrhea in female 02/01/2019   IUD (intrauterine device) in place 09/25/2018   Chlamydia infection 04/09/2018    Past Surgical History:  Procedure Laterality Date   ORIF ANKLE FRACTURE Right 01/30/2020   Procedure: OPEN REDUCTION INTERNAL FIXATION (ORIF) RIGHT ANKLE FRACTURE;  Surgeon: Newt Minion, MD;  Location: [facility];  Service: Orthopedics;  Laterality: Right;   WISDOM TOOTH EXTRACTION      OB History     Gravida  1   Para  0   Term  0   Preterm  0   AB  0   Living  0      SAB  0   IAB  0   Ectopic  0   Multiple  0   Live Births  0            Home Medications    Prior to Admission medications   Medication Sig Start Date End Date Taking? Authorizing Provider  misoprostol (CYTOTEC) 200 MCG tablet Take 4 tablets (800 mcg total) by mouth once for 1 dose. Place 2 pills between the gums and cheeks on both sides of your mouth. 01/12/22 01/12/22  Darci Current, DO  ondansetron (ZOFRAN-ODT) 4 MG disintegrating tablet Take 1 tablet (4 mg total) by mouth every 8 (eight) hours as needed for nausea or vomiting. Patient not taking: Reported on 01/10/2022 11/03/21   Hazel Sams, PA-C  Prenatal Vit-Fe Fumarate-FA (PRENATAL MULTIVITAMIN) TABS tablet Take 2 tablets by mouth daily at 12 noon. Patient not taking: Reported on 02/11/2022    [provider]  promethazine (PHENERGAN) 25 MG tablet Take 1 tablet (25 mg total) by mouth every 6 (six) hours as needed for nausea. Patient not taking: Reported on 02/11/2022 01/04/22   Chancy Milroy, MD    Family History Family History  Problem Relation Age of Onset   Cancer - Colon Maternal Grandmother     Social History Social History   Tobacco Use   Smoking status: Never    Passive exposure: Never   Smokeless tobacco: Never  Vaping Use   Vaping Use: Never used  Substance Use Topics   Alcohol use: No   Drug use: No     Allergies   Patient has no known allergies.   Review of Systems Review of Systems Per HPI  Physical Exam Triage Vital Signs ED Triage Vitals [02/17/22 1423]  Enc Vitals Group     BP 102/66     Pulse Rate 98     Resp 16  Temp 98.4 F (36.9 C)     Temp Source Oral     SpO2 98 %     Weight      Height      Head Circumference      Peak Flow      Pain Score 6     Pain Loc      Pain Edu?      Excl. in GC?    No data found.  Updated Vital Signs BP 102/66 (BP Location: Right Arm)   Pulse 98   Temp 98.4 F (36.9 C) (Oral)   Resp 16   LMP  (LMP Unknown)   SpO2 98%   Breastfeeding No   Visual Acuity Right Eye Distance:   Left Eye Distance:   Bilateral Distance:    Right Eye Near:   Left Eye Near:    Bilateral Near:     Physical Exam Constitutional:      General: She is not in acute distress.    Appearance: Normal appearance. She is not toxic-appearing or diaphoretic.  HENT:     Head: Normocephalic and atraumatic.  Eyes:     Extraocular Movements: Extraocular movements intact.     Conjunctiva/sclera: Conjunctivae normal.  Pulmonary:     Effort: Pulmonary effort is normal.  Musculoskeletal:     Comments: Tenderness to palpation to medial malleolus  with associated nonpitting edema.  Surgical scar noted at area as well.  No obvious discoloration.  Tenderness to proximal portion of dorsal foot overlying 2nd-3rd metatarsal. .  Mild tenderness to heel.  Thompson's test is negative.  No tenderness to calf.  Capillary refill and pulses normal.  Neurological:     General: No focal deficit present.     Mental Status: She is alert and oriented to person, place, and time. Mental status is at baseline.  Psychiatric:        Mood and Affect: Mood normal.        Behavior: Behavior normal.        Thought Content: Thought content normal.        Judgment: Judgment normal.      UC Treatments / Results  Labs (all labs ordered are listed, but only abnormal results are displayed) Labs Reviewed - No data to display  EKG   Radiology DG Foot Complete Right  Result Date: 02/17/2022 CLINICAL DATA:  Right posterior plantar foot pain starting 3 days ago. Right ankle radiographs 03/17/2020, right foot radiographs 01/22/2020 EXAM: RIGHT FOOT COMPLETE - 3+ VIEW COMPARISON:  None Available. FINDINGS: Redemonstration of 2 screws fixating the prior remote medial malleolar fracture. Normal bone mineralization. Normal alignment. Joint spaces are preserved. No acute fracture is seen. No dislocation. IMPRESSION: No acute fracture. Electronically Signed   By: Neita Garnet M.D.   On: 02/17/2022 15:00    Procedures Procedures (including critical care time)  Medications Ordered in UC Medications - No data to display  Initial Impression / Assessment and Plan / UC Course  I have reviewed the triage vital signs and the nursing notes.  Pertinent labs & imaging results that were available during my care of the patient were reviewed by me and considered in my medical decision making (see chart for details).     Left foot x-ray visualizing ankle showed that surgical hardware was still in place.  No obvious acute bony abnormality noted.  Unsure exact etiology of  patient's pain and inflammation.  Patient reports that she stands for long periods of time at work  so this could be the cause.  Advised rest, elevation of extremity, ice application.  Advised patient it would be best to follow-up with orthopedist for further evaluation and management given surgery in that foot to assess for any complications.  Advised over-the-counter pain relievers as well.  Provided patient with contact information for orthopedist.  Patient verbalized understanding and was agreeable with plan. Final Clinical Impressions(s) / UC Diagnoses   Final diagnoses:  Right foot pain     Discharge Instructions      Your x-ray appears normal.  Recommend ice application and elevation of extremity.  Please follow-up with orthopedist for further evaluation and management.     ED Prescriptions   None    PDMP not reviewed this encounter.   Gustavus Bryant, [place] 02/17/22 [phone]

## 2022-02-17 NOTE — ED Triage Notes (Signed)
Pt c/o posterior plantar pain to rain foot onset ~ 3 days ago. Reports stands a lot at work. Denies injury/trauma.

## 2022-03-03 ENCOUNTER — Encounter: Payer: Medicaid Other | Admitting: Obstetrics and Gynecology

## 2022-04-14 ENCOUNTER — Inpatient Hospital Stay
Admission: RE | Admit: 2022-04-14 | Discharge: 2022-04-14 | Disposition: A | Discharge status: Home / Self Care | Payer: Medicaid Other | Source: Ambulatory Visit | Attending: Nurse Practitioner | Admitting: Nurse Practitioner

## 2022-06-08 ENCOUNTER — Emergency Department (HOSPITAL_COMMUNITY)
Admission: EM | Admit: 2022-06-08 | Discharge: 2022-06-08 | Disposition: A | Discharge status: Home / Self Care | Payer: Self-pay | Attending: Emergency Medicine | Admitting: Emergency Medicine

## 2022-06-08 ENCOUNTER — Encounter (HOSPITAL_COMMUNITY): Payer: Self-pay

## 2022-06-08 ENCOUNTER — Other Ambulatory Visit: Payer: Self-pay

## 2022-06-08 DIAGNOSIS — S0502XA Injury of conjunctiva and corneal abrasion without foreign body, left eye, initial encounter: Secondary | ICD-10-CM | POA: Insufficient documentation

## 2022-06-08 DIAGNOSIS — X58XXXA Exposure to other specified factors, initial encounter: Secondary | ICD-10-CM | POA: Insufficient documentation

## 2022-06-08 MED ORDER — ACETAMINOPHEN 500 MG PO TABS
1000.0000 mg | ORAL_TABLET | Freq: Once | ORAL | Status: AC
  Administered 2022-06-08: 1000 mg via ORAL
  Filled 2022-06-08: qty 2

## 2022-06-08 MED ORDER — TETRACAINE HCL 0.5 % OP SOLN
1.0000 [drp] | Freq: Once | OPHTHALMIC | Status: AC
  Administered 2022-06-08: 1 [drp] via OPHTHALMIC
  Filled 2022-06-08: qty 4

## 2022-06-08 MED ORDER — CIPROFLOXACIN HCL 0.3 % OP SOLN
1.0000 [drp] | Freq: Once | OPHTHALMIC | Status: AC
  Administered 2022-06-08: 1 [drp] via OPHTHALMIC
  Filled 2022-06-08: qty 2.5

## 2022-06-08 MED ORDER — CIPROFLOXACIN HCL 0.3 % OP SOLN: 1.0000 [drp] | OPHTHALMIC | 0 refills | Status: DC

## 2022-06-08 MED ORDER — FLUORESCEIN SODIUM 1 MG OP STRP
1.0000 | ORAL_STRIP | Freq: Once | OPHTHALMIC | Status: AC
  Administered 2022-06-08: 1 via OPHTHALMIC
  Filled 2022-06-08: qty 1

## 2022-06-08 NOTE — ED Notes (Signed)
Snellen Chart: 20/10 R eye and unable to successfully complete eye exam for L eye d/t the pain of keeping eye open.

## 2022-06-08 NOTE — ED Provider Triage Note (Signed)
Emergency Medicine Provider Triage Evaluation Note  Octa Uplinger , a 22 y.o. female  was evaluated in triage.  Pt complains of left eye pain after having eyelashes put on   Review of Systems  Positive: Eye pain  Negative: No vision loss  Physical Exam  BP 114/70   Pulse (!) 106   Temp 99.3 F (37.4 C) (Oral)   Resp 16   Ht 5\' 4"  (1.626 m)   Wt 58.5 kg   SpO2 100%   BMI 22.14 kg/m  Gen:   Awake, no distress   Resp:  Normal effort  MSK:   Moves extremities without difficulty  Other:  Left eye redness and tearing   Medical Decision Making  Medically screening exam initiated at 4:59 PM.  Appropriate orders placed.  Arnell Chiles-Taylor was informed that the remainder of the evaluation will be completed by another provider, this initial triage assessment does not replace that evaluation, and the importance of remaining in the ED until their evaluation is complete.     Fransico Meadow, [place] 06/08/22 1702

## 2022-06-08 NOTE — Discharge Instructions (Signed)
You are seen today in the emergency department due to left eye pain.  I think this was likely irritation from the glue used on your eyelash extensions.  You have a slight abrasion to your cornea which should heal with the antibiotic drops, use every 6 hours.  Follow-up with ophthalmology in the next 48 hours, information above to call and schedule an appointment.  Return to the ED if you have complete loss of vision in that eye.

## 2022-06-08 NOTE — ED Triage Notes (Signed)
Pt states she got her eye lashes done yesterday and got them wet a little and now the left eye feels like there's something in it. Pt states her left eye hurts to open it. Pt's left sclera is erythematous. Pt states her left eye vision gets blurred when she feels something move around her left eye.

## 2022-06-08 NOTE — ED Provider Notes (Signed)
[facility] EMERGENCY DEPARTMENT Provider Note   CSN: [id number] Arrival date & time: 06/08/22  1626     History  Chief Complaint  Patient presents with   Eye Pain    Rachael Cook is a 22 y.o. female.   Eye Pain     Patient with no comorbidities presents to the emergency department due to left eye pain.  Happened yesterday after having eyelash extensions put on.  She does not wear contacts or glasses but states she feels like something is stuck inside of her eye.  Its painful, associated with tearing and blurriness when her eyes tear.  She tried washing it out without any improvement.  She does not wear contacts or glasses.   Home Medications Prior to Admission medications   Medication Sig Start Date End Date Taking? Authorizing Provider  ciprofloxacin (CILOXAN) 0.3 % ophthalmic solution Place 1 drop into the left eye every 2 (two) hours. Administer 1 drop, every 2 hours, while awake, for 2 days. Then 1 drop, every 4 hours, while awake, for the next 5 days. 06/08/22  Yes Theron Arista, PA-C  misoprostol (CYTOTEC) 200 MCG tablet Take 4 tablets (800 mcg total) by mouth once for 1 dose. Place 2 pills between the gums and cheeks on both sides of your mouth. 01/12/22 01/12/22  Glendale Chard, DO  ondansetron (ZOFRAN-ODT) 4 MG disintegrating tablet Take 1 tablet (4 mg total) by mouth every 8 (eight) hours as needed for nausea or vomiting. Patient not taking: Reported on 01/10/2022 11/03/21   Rhys Martini, PA-C  Prenatal Vit-Fe Fumarate-FA (PRENATAL MULTIVITAMIN) TABS tablet Take 2 tablets by mouth daily at 12 noon. Patient not taking: Reported on 02/11/2022    [provider]  promethazine (PHENERGAN) 25 MG tablet Take 1 tablet (25 mg total) by mouth every 6 (six) hours as needed for nausea. Patient not taking: Reported on 02/11/2022 01/04/22   Hermina Staggers, MD      Allergies    Patient has no known allergies.    Review of Systems   Review of Systems   Eyes:  Positive for pain.    Physical Exam Updated Vital Signs BP 114/70   Pulse (!) 106   Temp 99.3 F (37.4 C) (Oral)   Resp 16   Ht 5\' 4"  (1.626 m)   Wt 58.5 kg   SpO2 100%   BMI 22.14 kg/m  Physical Exam Vitals and nursing note reviewed. Exam conducted with a chaperone present.  Constitutional:      General: She is not in acute distress.    Appearance: Normal appearance.  HENT:     Head: Normocephalic and atraumatic.  Eyes:     General: No scleral icterus.    Extraocular Movements: Extraocular movements intact.     Pupils: Pupils are equal, round, and reactive to light.     Comments: Left eye is injected, fluorescein uptake consistent with abrasion without ulceration.  I everted the eyelid and do not appreciate a foreign body.  EOMI, pain resolution with the tetracaine drops.  Skin:    Coloration: Skin is not jaundiced.  Neurological:     Mental Status: She is alert. Mental status is at baseline.     Coordination: Coordination normal.     ED Results / Procedures / Treatments   Labs (all labs ordered are listed, but only abnormal results are displayed) Labs Reviewed - No data to display  EKG None  Radiology No results found.  Procedures Procedures  Medications Ordered in ED Medications  ciprofloxacin (CILOXAN) 0.3 % ophthalmic solution 1 drop (has no administration in time range)  acetaminophen (TYLENOL) tablet 1,000 mg (has no administration in time range)  fluorescein ophthalmic strip 1 strip (1 strip Left Eye Given 06/08/22 2009)  tetracaine (PONTOCAINE) 0.5 % ophthalmic solution 1 drop (1 drop Left Eye Given 06/08/22 2009)    ED Course/ Medical Decision Making/ A&P                           Medical Decision Making Risk OTC drugs. Prescription drug management.   Patient presents to the emergency department due to left eye pain.  On exam she has EOMI, pupils are reactive and PERRLA.  There is increased fluorescein uptake consistent with corneal  abrasion, I did not appreciate any retained foreign body.  Will cover with antibiotic drops for suspected corneal abrasion and have her follow-up with ophthalmology.  Referral was provided.  Return precaution discussed with patient who verbalized understanding.          Final Clinical Impression(s) / ED Diagnoses Final diagnoses:  Abrasion of left cornea, initial encounter    Rx / DC Orders ED Discharge Orders          Ordered    ciprofloxacin (CILOXAN) 0.3 % ophthalmic solution  Every 2 hours        06/08/22 2029              Sherrill Raring, PA-C 06/08/22 2158    Drenda Freeze, MD 06/08/22 [phone]

## 2022-06-08 NOTE — ED Notes (Signed)
Medication requested from pharmacy at this time 

## 2022-06-11 ENCOUNTER — Emergency Department (HOSPITAL_BASED_OUTPATIENT_CLINIC_OR_DEPARTMENT_OTHER)
Admission: EM | Admit: 2022-06-11 | Discharge: 2022-06-11 | Disposition: A | Discharge status: Home / Self Care | Payer: Self-pay | Attending: Emergency Medicine | Admitting: Emergency Medicine

## 2022-06-11 ENCOUNTER — Other Ambulatory Visit: Payer: Self-pay

## 2022-06-11 ENCOUNTER — Encounter (HOSPITAL_BASED_OUTPATIENT_CLINIC_OR_DEPARTMENT_OTHER): Payer: Self-pay

## 2022-06-11 DIAGNOSIS — S0502XA Injury of conjunctiva and corneal abrasion without foreign body, left eye, initial encounter: Secondary | ICD-10-CM | POA: Insufficient documentation

## 2022-06-11 DIAGNOSIS — X58XXXA Exposure to other specified factors, initial encounter: Secondary | ICD-10-CM | POA: Insufficient documentation

## 2022-06-11 MED ORDER — KETOROLAC TROMETHAMINE 0.5 % OP SOLN: 1.0000 [drp] | Freq: Four times a day (QID) | OPHTHALMIC | 0 refills | Status: DC

## 2022-06-11 NOTE — ED Provider Notes (Signed)
[place] EMERGENCY DEPT Provider Note   CSN: [id number] Arrival date & time: 06/11/22  1842     History  Chief Complaint  Patient presents with   Eye Pain    Rachael Cook is a 22 y.o. female who presents emergency department for reevaluation of left eye irritation.  Patient was seen 2 days ago after she got lash glue in her eye.  She felt like it scratched her eye.  On evaluation patient was noted to have a corneal abrasion was discharged charged with Ciloxan ophthalmic eyedrops.  She returns because she still has some photophobia states that she feels like her eyelid is a little bit swollen.  She denies significant tearing, significant pain, visual disturbances.  She states that dark glasses help but she is unable to wear them at work.  She has follow-up with pulmonology in 2 weeks.   Eye Pain       Home Medications Prior to Admission medications   Medication Sig Start Date End Date Taking? Authorizing Provider  ciprofloxacin (CILOXAN) 0.3 % ophthalmic solution Place 1 drop into the left eye every 2 (two) hours. Administer 1 drop, every 2 hours, while awake, for 2 days. Then 1 drop, every 4 hours, while awake, for the next 5 days. 06/08/22   Sherrill Raring, PA-C  misoprostol (CYTOTEC) 200 MCG tablet Take 4 tablets (800 mcg total) by mouth once for 1 dose. Place 2 pills between the gums and cheeks on both sides of your mouth. 01/12/22 01/12/22  Darci Current, DO  ondansetron (ZOFRAN-ODT) 4 MG disintegrating tablet Take 1 tablet (4 mg total) by mouth every 8 (eight) hours as needed for nausea or vomiting. Patient not taking: Reported on 01/10/2022 11/03/21   Hazel Sams, PA-C  Prenatal Vit-Fe Fumarate-FA (PRENATAL MULTIVITAMIN) TABS tablet Take 2 tablets by mouth daily at 12 noon. Patient not taking: Reported on 02/11/2022    [provider]  promethazine (PHENERGAN) 25 MG tablet Take 1 tablet (25 mg total) by mouth every 6 (six) hours as needed for  nausea. Patient not taking: Reported on 02/11/2022 01/04/22   Chancy Milroy, MD      Allergies    Patient has no known allergies.    Review of Systems   Review of Systems  Eyes:  Positive for pain.    Physical Exam Updated Vital Signs BP 102/75 (BP Location: Right Arm)   Pulse (!) 108   Temp 97.9 F (36.6 C)   Resp 16   Ht 5\' 4"  (1.626 m)   Wt 56.7 kg   LMP 05/23/2022 (Exact Date)   SpO2 98%   BMI 21.46 kg/m  Physical Exam Vitals and nursing note reviewed.  Constitutional:      General: She is not in acute distress.    Appearance: She is well-developed. She is not diaphoretic.  HENT:     Head: Normocephalic and atraumatic.     Right Ear: External ear normal.     Left Ear: External ear normal.     Nose: Nose normal.     Mouth/Throat:     Mouth: Mucous membranes are moist.  Eyes:     General: No scleral icterus.    Conjunctiva/sclera: Conjunctivae normal.     Comments: No scleral injection, no significant swelling, PERRL, EOM I, no chemosis, tolerating light in the room without significant pain or tearing  Cardiovascular:     Rate and Rhythm: Normal rate and regular rhythm.     Heart sounds: Normal heart sounds.  No murmur heard.    No friction rub. No gallop.  Pulmonary:     Effort: Pulmonary effort is normal. No respiratory distress.     Breath sounds: Normal breath sounds.  Abdominal:     General: Bowel sounds are normal. There is no distension.     Palpations: Abdomen is soft. There is no mass.     Tenderness: There is no abdominal tenderness. There is no guarding.  Musculoskeletal:     Cervical back: Normal range of motion.  Skin:    General: Skin is warm and dry.  Neurological:     Mental Status: She is alert and oriented to person, place, and time.  Psychiatric:        Behavior: Behavior normal.     ED Results / Procedures / Treatments   Labs (all labs ordered are listed, but only abnormal results are displayed) Labs Reviewed - No data to  display  EKG None  Radiology No results found.  Procedures Procedures    Medications Ordered in ED Medications - No data to display  ED Course/ Medical Decision Making/ A&P                             Medical Decision Making Patient here for reevaluation of corneal abrasion.  She seems be tolerating it very well.  I discussed that generally these here for heal fairly quickly and she continue with her Ciloxan drops.  Will add ketorolac drops.  She should continue with her outpatient exam with ophthalmology.  Will provide the patient with a note for ability to wear dark glasses during work due to her eye injury.  She is otherwise appropriate for discharge at this time without significant abnormalities.  Doubt uveitis or iritis  Risk Prescription drug management.           Final Clinical Impression(s) / ED Diagnoses Final diagnoses:  Abrasion of left cornea, initial encounter    Rx / DC Orders ED Discharge Orders     None         Margarita Mail, PA-C 06/11/22 2357    Wyvonnia Dusky, MD 06/14/22 [phone]

## 2022-06-11 NOTE — ED Triage Notes (Signed)
Pt states that she was seen 2 days ago for left eye irritation after a lash got I it. She states that she is continuing to have pain and sensitivity with light as well as blurred vision. She was told to follow up with optometry but can't get an appt until 2 weeks.

## 2022-06-11 NOTE — Discharge Instructions (Signed)
Contact a health care provider if: You continue to have eye pain and other symptoms for more than 2 days. You have new symptoms, such as worse redness, tearing, or discharge. You have discharge that makes your eyelids stick together in the morning. Your eye patch becomes so loose that you can blink your eye. Symptoms return after the original abrasion has healed. Get help right away if: You have severe eye pain that does not get better with medicine. You have vision loss.

## 2022-07-01 ENCOUNTER — Emergency Department (HOSPITAL_BASED_OUTPATIENT_CLINIC_OR_DEPARTMENT_OTHER)
Admission: EM | Admit: 2022-07-01 | Discharge: 2022-07-01 | Disposition: A | Discharge status: Home / Self Care | Payer: Self-pay | Attending: Emergency Medicine | Admitting: Emergency Medicine

## 2022-07-01 ENCOUNTER — Other Ambulatory Visit: Payer: Self-pay

## 2022-07-01 ENCOUNTER — Encounter (HOSPITAL_BASED_OUTPATIENT_CLINIC_OR_DEPARTMENT_OTHER): Payer: Self-pay

## 2022-07-01 DIAGNOSIS — H66001 Acute suppurative otitis media without spontaneous rupture of ear drum, right ear: Secondary | ICD-10-CM | POA: Insufficient documentation

## 2022-07-01 DIAGNOSIS — J069 Acute upper respiratory infection, unspecified: Secondary | ICD-10-CM | POA: Insufficient documentation

## 2022-07-01 DIAGNOSIS — Z1152 Encounter for screening for COVID-19: Secondary | ICD-10-CM | POA: Insufficient documentation

## 2022-07-01 LAB — RESP PANEL BY RT-PCR (RSV, FLU A&B, COVID)  RVPGX2
Influenza A by PCR: NEGATIVE
Influenza B by PCR: NEGATIVE
Resp Syncytial Virus by PCR: NEGATIVE
SARS Coronavirus 2 by RT PCR: NEGATIVE

## 2022-07-01 LAB — GROUP A STREP BY PCR: Group A Strep by PCR: NOT DETECTED

## 2022-07-01 MED ORDER — AMOXICILLIN 875 MG PO TABS: 875.0000 mg | ORAL_TABLET | Freq: Two times a day (BID) | ORAL | 0 refills | Status: AC

## 2022-07-01 NOTE — ED Triage Notes (Signed)
Patient here POV from Home.  Endorses URI Symptoms such as Sore Throat, Cough, Chills for approximately 5-7 Days.   No fever.  NAD Noted during Triage. A&Ox4. Gcs 15. Ambulatory.

## 2022-07-01 NOTE — Discharge Instructions (Addendum)
Thank you for allowing me to be part of your care today.  You were negative for strep, COVID, flu, and RSV.  I have sent over an antibiotic to your pharmacy to treat the ear infection you have on the right side.  Please take the entire prescription to completion to clear up this infection.   I recommend alternating Tylenol and ibuprofen every 4 hours as needed for body aches.  You can take up to 1000 mg of Tylenol per dose (do not exceed 4000 mg in a 24 hour period) and 800 mg ibuprofen per dose (do not exceed 3200 mg in a 24 hour period).    For productive cough during the day, I recommend taking Mucinex (guaifenesin).  At night time, I recommend Delsym (dextromethorphan) which is a cough suppressant.  Be sure to stay well hydrated and get plenty of rest while you are sick.    I recommend follow-up with your primary care provider if your symptoms continue to persist.  It is not unusual to have a lingering cough even after your other symptoms have resolved.   Return to the ED if you experience worsening of your symptoms such as shortness of breath, chest pain or chest tightness, syncope (fainting or passing out), palpitations, fever that does not respond to treatment.

## 2022-07-01 NOTE — ED Provider Notes (Signed)
[facility] Provider Note   CSN: [id number] Arrival date & time: 07/01/22  1416     History  Chief Complaint  Patient presents with   [name] is a 22 y.o. female presents to the ED complaining of sore throat, cough, congestion, rhinorrhea, chills and night sweats.  She has not been around anyone that she knows is sick.  She came to ED due to having symptoms for so long and not getting better.  She has been using OTC cough/cold medicines without much relief.  Denies fever, chest tightness, shortness of breath, otalgia, nausea, vomiting, diarrhea, or trouble swallowing.         Home Medications Prior to Admission medications   Medication Sig Start Date End Date Taking? Authorizing Provider  amoxicillin (AMOXIL) 875 MG tablet Take 1 tablet (875 mg total) by mouth 2 (two) times daily for 5 days. 07/01/22 07/06/22 Yes Jaxn Chiquito R, PA  ciprofloxacin (CILOXAN) 0.3 % ophthalmic solution Place 1 drop into the left eye every 2 (two) hours. Administer 1 drop, every 2 hours, while awake, for 2 days. Then 1 drop, every 4 hours, while awake, for the next 5 days. 06/08/22   Sherrill Raring, PA-C  ketorolac (ACULAR) 0.5 % ophthalmic solution Place 1 drop into both eyes every 6 (six) hours. 06/11/22   Margarita Mail, PA-C  misoprostol (CYTOTEC) 200 MCG tablet Take 4 tablets (800 mcg total) by mouth once for 1 dose. Place 2 pills between the gums and cheeks on both sides of your mouth. 01/12/22 01/12/22  Darci Current, DO  ondansetron (ZOFRAN-ODT) 4 MG disintegrating tablet Take 1 tablet (4 mg total) by mouth every 8 (eight) hours as needed for nausea or vomiting. Patient not taking: Reported on 01/10/2022 11/03/21   Hazel Sams, PA-C  Prenatal Vit-Fe Fumarate-FA (PRENATAL MULTIVITAMIN) TABS tablet Take 2 tablets by mouth daily at 12 noon. Patient not taking: Reported on 02/11/2022    [provider]  promethazine (PHENERGAN) 25  MG tablet Take 1 tablet (25 mg total) by mouth every 6 (six) hours as needed for nausea. Patient not taking: Reported on 02/11/2022 01/04/22   Chancy Milroy, MD      Allergies    Patient has no known allergies.    Review of Systems   Review of Systems  Constitutional:  Positive for chills. Negative for fever.  HENT:  Positive for congestion, rhinorrhea and sore throat. Negative for ear pain and trouble swallowing.   Respiratory:  Positive for cough. Negative for chest tightness and shortness of breath.   Gastrointestinal:  Negative for diarrhea, nausea and vomiting.    Physical Exam Updated Vital Signs BP 120/78 (BP Location: Right Arm)   Pulse (!) 111   Temp 99.3 F (37.4 C) (Oral)   Resp 18   Ht 5\' 4"  (1.626 m)   Wt 56.7 kg   LMP 05/23/2022 (Exact Date)   SpO2 100%   BMI 21.46 kg/m  Physical Exam Vitals and nursing note reviewed.  Constitutional:      General: She is not in acute distress.    Appearance: Normal appearance. She is not ill-appearing or diaphoretic.  HENT:     Head: Normocephalic.     Right Ear: Ear canal normal. A middle ear effusion is present. Tympanic membrane is erythematous and bulging.     Left Ear: Ear canal normal. A middle ear effusion is present. Tympanic membrane is not injected, erythematous or bulging.  Mouth/Throat:     Lips: Pink.     Mouth: Mucous membranes are moist.     Pharynx: Oropharynx is clear. Uvula midline. Posterior oropharyngeal erythema present. No pharyngeal swelling or uvula swelling.     Tonsils: No tonsillar exudate or tonsillar abscesses. 1+ on the right. 1+ on the left.     Comments: Post nasal drip appreciated in posterior oropharynx  Eyes:     Conjunctiva/sclera: Conjunctivae normal.     Pupils: Pupils are equal, round, and reactive to light.  Cardiovascular:     Rate and Rhythm: Normal rate and regular rhythm.     Heart sounds: Normal heart sounds. No murmur heard. Pulmonary:     Effort: Pulmonary effort is  normal. No tachypnea or respiratory distress.     Breath sounds: Normal breath sounds and air entry. No wheezing or rhonchi.  Abdominal:     General: Abdomen is flat.     Palpations: Abdomen is soft.     Tenderness: There is no abdominal tenderness.  Lymphadenopathy:     Cervical: No cervical adenopathy.  Skin:    General: Skin is warm and dry.     Capillary Refill: Capillary refill takes less than 2 seconds.     Coloration: Skin is not cyanotic or pale.  Neurological:     Mental Status: She is alert. Mental status is at baseline.  Psychiatric:        Mood and Affect: Mood normal.        Behavior: Behavior normal.     ED Results / Procedures / Treatments   Labs (all labs ordered are listed, but only abnormal results are displayed) Labs Reviewed  RESP PANEL BY RT-PCR (RSV, FLU A&B, COVID)  RVPGX2  GROUP A STREP BY PCR    EKG None  Radiology No results found.  Procedures Procedures    Medications Ordered in ED Medications - No data to display  ED Course/ Medical Decision Making/ A&P                             Medical Decision Making  This patient presents to the ED with chief complaint(s) of URI symptoms for the past week with pertinent past medical history of sickle cell trait .The complaint involves an extensive differential diagnosis and also carries with it a high risk of complications and morbidity.    The differential diagnosis includes COVID, influenza, RSV, adenovirus, Group A Strep pharyngitis, viral pharyngitis, URI secondary to other viral etiology, sinusitis, rhinovirus   The initial plan is to obtain strep and resp swabs   Additional history obtained: Additional history obtained from  none Records reviewed  none  Initial Assessment:   On exam, patient appears to be resting comfortably in bed and is overall well appearing.  Lungs are clear to auscultation bilaterally.  Heart rate is normal in the 90s with regular rhythm, no appreciable murmur.   Congestion with clear rhinorrhea appreciated.  She also has an erythematous posterior oropharynx with post nasal drip.  Right TM is erythematous and bulging with mid ear effusion.  Left TM has mid ear effusion that appears serous, but it is not bulging or erythematous.  No appreciable cervical lymphadenopathy on palpation.    Independent ECG/labs interpretation:  The following labs were independently interpreted:  Group A Strep negative;   Independent visualization and interpretation of imaging: I independently visualized the following imaging with scope of interpretation limited to determining acute life  threatening conditions related to emergency care: Not indicated at this time  Treatment and Reassessment: Patient does not require treatment in ED at this time.  Antibiotics will be sent to patient's pharmacy to treat acute otitis media of the right ear.    Disposition:   The patient has been appropriately medically screened and/or stabilized in the ED. I have low suspicion for any other emergent medical condition which would require further screening, evaluation or treatment in the ED or require inpatient management. At time of discharge the patient is hemodynamically stable and in no acute distress. I have discussed work-up results and diagnosis with patient and answered all questions. Patient is agreeable with discharge plan. We discussed strict return precautions for returning to the emergency department and they verbalized understanding.            Final Clinical Impression(s) / ED Diagnoses Final diagnoses:  Non-recurrent acute suppurative otitis media of right ear without spontaneous rupture of tympanic membrane  Upper respiratory tract infection, unspecified type    Rx / DC Orders ED Discharge Orders          Ordered    amoxicillin (AMOXIL) 875 MG tablet  2 times daily        07/01/22 [address] [place], [place] 07/01/22 1533    Malvin Johns,  MD 07/01/22 1730

## 2022-08-03 ENCOUNTER — Other Ambulatory Visit: Payer: Self-pay

## 2022-08-03 ENCOUNTER — Emergency Department (HOSPITAL_BASED_OUTPATIENT_CLINIC_OR_DEPARTMENT_OTHER)
Admission: EM | Admit: 2022-08-03 | Discharge: 2022-08-03 | Disposition: A | Discharge status: Home / Self Care | Payer: Medicaid Other | Attending: Emergency Medicine | Admitting: Emergency Medicine

## 2022-08-03 ENCOUNTER — Encounter (HOSPITAL_BASED_OUTPATIENT_CLINIC_OR_DEPARTMENT_OTHER): Payer: Self-pay | Admitting: Emergency Medicine

## 2022-08-03 ENCOUNTER — Emergency Department (HOSPITAL_BASED_OUTPATIENT_CLINIC_OR_DEPARTMENT_OTHER): Payer: Medicaid Other

## 2022-08-03 DIAGNOSIS — R000 Tachycardia, unspecified: Secondary | ICD-10-CM | POA: Insufficient documentation

## 2022-08-03 DIAGNOSIS — O2341 Unspecified infection of urinary tract in pregnancy, first trimester: Secondary | ICD-10-CM | POA: Insufficient documentation

## 2022-08-03 DIAGNOSIS — Z1152 Encounter for screening for COVID-19: Secondary | ICD-10-CM | POA: Diagnosis not present

## 2022-08-03 DIAGNOSIS — Z3A00 Weeks of gestation of pregnancy not specified: Secondary | ICD-10-CM | POA: Insufficient documentation

## 2022-08-03 DIAGNOSIS — Z3491 Encounter for supervision of normal pregnancy, unspecified, first trimester: Secondary | ICD-10-CM

## 2022-08-03 DIAGNOSIS — R7401 Elevation of levels of liver transaminase levels: Secondary | ICD-10-CM | POA: Diagnosis not present

## 2022-08-03 DIAGNOSIS — N390 Urinary tract infection, site not specified: Secondary | ICD-10-CM

## 2022-08-03 DIAGNOSIS — D72829 Elevated white blood cell count, unspecified: Secondary | ICD-10-CM | POA: Insufficient documentation

## 2022-08-03 DIAGNOSIS — O99891 Other specified diseases and conditions complicating pregnancy: Secondary | ICD-10-CM | POA: Diagnosis present

## 2022-08-03 LAB — COMPREHENSIVE METABOLIC PANEL
ALT: 300 U/L — ABNORMAL HIGH (ref 0–44)
AST: 238 U/L — ABNORMAL HIGH (ref 15–41)
Albumin: 4.3 g/dL (ref 3.5–5.0)
Alkaline Phosphatase: 269 U/L — ABNORMAL HIGH (ref 38–126)
Anion gap: 8 (ref 5–15)
BUN: 8 mg/dL (ref 6–20)
CO2: 27 mmol/L (ref 22–32)
Calcium: 9.8 mg/dL (ref 8.9–10.3)
Chloride: 101 mmol/L (ref 98–111)
Creatinine, Ser: 0.71 mg/dL (ref 0.44–1.00)
GFR, Estimated: >60 mL/min (ref >60)
Glucose, Bld: 91 mg/dL (ref 70–99)
Potassium: 3.7 mmol/L (ref 3.5–5.1)
Sodium: 136 mmol/L (ref 135–145)
Total Bilirubin: 0.8 mg/dL (ref 0.3–1.2)
Total Protein: 7.7 g/dL (ref 6.5–8.1)

## 2022-08-03 LAB — URINALYSIS, ROUTINE W REFLEX MICROSCOPIC
Bilirubin Urine: NEGATIVE
Glucose, UA: NEGATIVE mg/dL
Nitrite: NEGATIVE
Protein, ur: NEGATIVE mg/dL
Specific Gravity, Urine: 1.008 (ref 1.005–1.030)
WBC, UA: >50 WBC/hpf (ref 0–5)
pH: 7 (ref 5.0–8.0)

## 2022-08-03 LAB — CBC WITH DIFFERENTIAL/PLATELET
Abs Immature Granulocytes: 0.04 10*3/uL (ref 0.00–0.07)
Basophils Absolute: 0.2 10*3/uL — ABNORMAL HIGH (ref 0.0–0.1)
Basophils Relative: 2 %
Eosinophils Absolute: 0.1 10*3/uL (ref 0.0–0.5)
Eosinophils Relative: 1 %
HCT: 36.5 % (ref 36.0–46.0)
Hemoglobin: 13 g/dL (ref 12.0–15.0)
Immature Granulocytes: 0 %
Lymphocytes Relative: 61 %
Lymphs Abs: 8.4 10*3/uL — ABNORMAL HIGH (ref 0.7–4.0)
MCH: 30.1 pg (ref 26.0–34.0)
MCHC: 35.6 g/dL (ref 30.0–36.0)
MCV: 84.5 fL (ref 80.0–100.0)
Monocytes Absolute: 1.7 10*3/uL — ABNORMAL HIGH (ref 0.1–1.0)
Monocytes Relative: 13 %
Neutro Abs: 3.2 10*3/uL (ref 1.7–7.7)
Neutrophils Relative %: 23 %
Platelets: 176 10*3/uL (ref 150–400)
RBC: 4.32 MIL/uL (ref 3.87–5.11)
RDW: 12.5 % (ref 11.5–15.5)
WBC: 13.7 10*3/uL — ABNORMAL HIGH (ref 4.0–10.5)
nRBC: 0 % (ref 0.0–0.2)

## 2022-08-03 LAB — HCG, QUANTITATIVE, PREGNANCY: hCG, Beta Chain, Quant, S: 36 m[IU]/mL — ABNORMAL HIGH (ref <5)

## 2022-08-03 LAB — PREGNANCY, URINE: Preg Test, Ur: NEGATIVE

## 2022-08-03 LAB — RESP PANEL BY RT-PCR (RSV, FLU A&B, COVID)  RVPGX2
Influenza A by PCR: NEGATIVE
Influenza B by PCR: NEGATIVE
Resp Syncytial Virus by PCR: NEGATIVE
SARS Coronavirus 2 by RT PCR: NEGATIVE

## 2022-08-03 LAB — LIPASE, BLOOD: Lipase: <10 U/L — ABNORMAL LOW (ref 11–51)

## 2022-08-03 LAB — ACETAMINOPHEN LEVEL: Acetaminophen (Tylenol), Serum: <10 ug/mL — ABNORMAL LOW (ref 10–30)

## 2022-08-03 MED ORDER — CEPHALEXIN 500 MG PO CAPS: 500.0000 mg | ORAL_CAPSULE | Freq: Four times a day (QID) | ORAL | 0 refills | Status: DC

## 2022-08-03 MED ORDER — LACTATED RINGERS IV BOLUS
1000.0000 mL | Freq: Once | INTRAVENOUS | Status: AC
  Administered 2022-08-03: 1000 mL via INTRAVENOUS

## 2022-08-03 MED ORDER — PROMETHAZINE HCL 25 MG PO TABS: 25.0000 mg | ORAL_TABLET | Freq: Four times a day (QID) | ORAL | 0 refills | Status: DC | PRN

## 2022-08-03 NOTE — ED Triage Notes (Signed)
Pt reports fever, chills, and body aches since Wednesday. Pt also reports positive home pregnancy test.

## 2022-08-03 NOTE — Discharge Instructions (Addendum)
You need to follow-up with a regular doctor or urgent care in the next week to have your liver test rechecked.  Avoid all Tylenol products.  Making sure you are staying hydrated.  If you start having high fevers, pain in your stomach, persistent vomiting return to the emergency room.

## 2022-08-03 NOTE — ED Provider Notes (Signed)
[facility] Provider Note   CSN: [id number] Arrival date & time: 08/03/22  1054     History {Add pertinent medical, surgical, social history, OB history to HPI:1} Chief Complaint  Patient presents with   Generalized Body Aches    Rachael Cook is a 22 y.o. female.  Patient is a 22 year old female with a history of sickle cell trait who is presenting today with complaints of 1 week of subjective fever or chills, intermittent bouts of abdominal pain, nausea and vomiting.  She has not had significant diarrhea within the last week and has not had significant congestion, cough or shortness of breath.  She had some minimal vaginal spotting and reports her last normal period was February 8 with normal menses 30 days before that.  She does not use any protection or take birth control to prevent pregnancy reports 1 miscarriage last year that she had to use misoprostol for.  She has not had any significant vaginal itching or discharge, no urinary symptoms.  However her symptoms are not improving.  She did have some nausea medication at home that she took prior to arrival and reports she is feeling better now.  She has been taking a lot of over-the-counter cough and cold medications as well as Tylenol without improvement.  She denies any bad food exposure, other sick contacts.  She reports taking 3 pregnancy tests at home that were positive.  The history is provided by the patient.       Home Medications Prior to Admission medications   Medication Sig Start Date End Date Taking? Authorizing Provider  ciprofloxacin (CILOXAN) 0.3 % ophthalmic solution Place 1 drop into the left eye every 2 (two) hours. Administer 1 drop, every 2 hours, while awake, for 2 days. Then 1 drop, every 4 hours, while awake, for the next 5 days. 06/08/22   Sherrill Raring, PA-C  ketorolac (ACULAR) 0.5 % ophthalmic solution Place 1 drop into both eyes every 6 (six) hours. 06/11/22    Margarita Mail, PA-C  misoprostol (CYTOTEC) 200 MCG tablet Take 4 tablets (800 mcg total) by mouth once for 1 dose. Place 2 pills between the gums and cheeks on both sides of your mouth. 01/12/22 01/12/22  Darci Current, DO  ondansetron (ZOFRAN-ODT) 4 MG disintegrating tablet Take 1 tablet (4 mg total) by mouth every 8 (eight) hours as needed for nausea or vomiting. Patient not taking: Reported on 01/10/2022 11/03/21   Hazel Sams, PA-C  promethazine (PHENERGAN) 25 MG tablet Take 1 tablet (25 mg total) by mouth every 6 (six) hours as needed for nausea. Patient not taking: Reported on 02/11/2022 01/04/22   Chancy Milroy, MD      Allergies    Patient has no known allergies.    Review of Systems   Review of Systems  Physical Exam Updated Vital Signs BP 119/67 (BP Location: Right Arm)   Pulse (!) 105   Temp 98.4 F (36.9 C) (Oral)   Resp 17   SpO2 100%  Physical Exam Vitals and nursing note reviewed.  Constitutional:      General: She is not in acute distress.    Appearance: She is well-developed.  HENT:     Head: Normocephalic and atraumatic.     Mouth/Throat:     Mouth: Mucous membranes are dry.  Eyes:     Pupils: Pupils are equal, round, and reactive to light.  Cardiovascular:     Rate and Rhythm: Regular rhythm. Tachycardia present.  Heart sounds: Normal heart sounds. No murmur heard.    No friction rub.  Pulmonary:     Effort: Pulmonary effort is normal.     Breath sounds: Normal breath sounds. No wheezing or rales.  Abdominal:     General: Bowel sounds are normal. There is no distension.     Palpations: Abdomen is soft.     Tenderness: There is no abdominal tenderness. There is no right CVA tenderness, left CVA tenderness, guarding or rebound.  Musculoskeletal:        General: No tenderness. Normal range of motion.     Comments: No edema  Skin:    General: Skin is warm and dry.     Findings: No rash.  Neurological:     Mental Status: She is alert and oriented  to person, place, and time. Mental status is at baseline.     Cranial Nerves: No cranial nerve deficit.  Psychiatric:        Behavior: Behavior normal.     ED Results / Procedures / Treatments   Labs (all labs ordered are listed, but only abnormal results are displayed) Labs Reviewed  CBC WITH DIFFERENTIAL/PLATELET - Abnormal; Notable for the following components:      Result Value   WBC 13.7 (*)    All other components within normal limits  RESP PANEL BY RT-PCR (RSV, FLU A&B, COVID)  RVPGX2  PREGNANCY, URINE  COMPREHENSIVE METABOLIC PANEL  LIPASE, BLOOD  HCG, QUANTITATIVE, PREGNANCY  URINALYSIS, ROUTINE W REFLEX MICROSCOPIC    EKG None  Radiology No results found.  Procedures Procedures  {Document cardiac monitor, telemetry assessment procedure when appropriate:1}  Medications Ordered in ED Medications  lactated ringers bolus 1,000 mL (1,000 mLs Intravenous New Bag/Given 08/03/22 1201)    ED Course/ Medical Decision Making/ A&P   {   Click here for ABCD2, HEART and other calculatorsREFRESH Note before signing :1}                          Medical Decision Making Amount and/or Complexity of Data Reviewed Labs: ordered.  Pt presenting today with a complaint that caries a high risk for morbidity and mortality.  Here today with complaints of persistent nausea vomiting, low-grade fevers and chills and bodyaches.  Also patient reports having a positive pregnancy test at home x 3 tests.  She has not not actually missed her menses as her last period was on 07/08/2022.  Patient is sexually active with only 1 partner has no discharge and low suspicion for STI at this time.  She has no lower abdominal pain and denies any urinary symptoms.  No significant flank pain or abdominal pain on exam currently.  Patient's urine pregnancy test was negative however given her 3 positive test we will do an hCG. Patient was given a liter of fluid due to concern for dehydration based on patient's  mild tachycardia and dry mouth.  No focal abdominal findings concerning for peritonitis at this time, appendicitis, diverticulitis or cholecystitis.  I independently interpreted patient's labs today she has a mild leukocytosis of 13, stable hemoglobin of 13, CMP today significant for transaminitis with an AST of 238 and an ALT of 300 but a normal total bilirubin and anion gap.  Creatinine and electrolytes are normal.  Discussing with the patient she does not drink alcohol regularly she is on no chronic medications but had been taking quite a bit of Tylenol since she has been ill.  Lipase  was within normal limits and quantitative hCG was elevated at 36.  Most likely patient is just very early pregnant.  Do not feel that her symptoms today are pregnancy related.  Low suspicion for ectopic pregnancy at this time.  Also patient's urine today with concern for infection with large leukocytes greater than 50 white cells with rare bacteria.  Will culture.  Also will do a right upper quadrant ultrasound to ensure no evidence of obstruction.  That is normal we will treat with antibiotic for the urine and get follow-up with OB/GYN for serial hCGs.  On repeat evaluation patient reports that she feels good she is not having any abdominal pain or nausea at this time.    {Document critical care time when appropriate:1} {Document review of labs and clinical decision tools ie heart score, Chads2Vasc2 etc:1}  {Document your independent review of radiology images, and any outside records:1} {Document your discussion with family members, caretakers, and with consultants:1} {Document social determinants of health affecting pt's care:1} {Document your decision making why or why not admission, treatments were needed:1} Final Clinical Impression(s) / ED Diagnoses Final diagnoses:  None    Rx / DC Orders ED Discharge Orders     None

## 2022-08-03 NOTE — ED Notes (Signed)
Dc instructions reviewed with patient. Patient voiced understanding. Dc with belongings. Patient is to speak with Rachael Cook in registration for a primary care appointment

## 2022-08-13 ENCOUNTER — Ambulatory Visit: Payer: Self-pay | Admitting: Nurse Practitioner

## 2022-08-19 ENCOUNTER — Encounter (HOSPITAL_COMMUNITY): Payer: Self-pay | Admitting: *Deleted

## 2022-08-19 ENCOUNTER — Inpatient Hospital Stay (HOSPITAL_COMMUNITY): Payer: Medicaid Other

## 2022-08-19 ENCOUNTER — Inpatient Hospital Stay (HOSPITAL_COMMUNITY)
Admission: AD | Admit: 2022-08-19 | Discharge: 2022-08-19 | Disposition: A | Discharge status: Home / Self Care | Payer: Medicaid Other | Attending: Obstetrics & Gynecology | Admitting: Obstetrics & Gynecology

## 2022-08-19 DIAGNOSIS — O26851 Spotting complicating pregnancy, first trimester: Secondary | ICD-10-CM | POA: Diagnosis not present

## 2022-08-19 DIAGNOSIS — N83202 Unspecified ovarian cyst, left side: Secondary | ICD-10-CM | POA: Diagnosis not present

## 2022-08-19 DIAGNOSIS — Z3A01 Less than 8 weeks gestation of pregnancy: Secondary | ICD-10-CM | POA: Diagnosis not present

## 2022-08-19 DIAGNOSIS — N939 Abnormal uterine and vaginal bleeding, unspecified: Secondary | ICD-10-CM

## 2022-08-19 DIAGNOSIS — O3481 Maternal care for other abnormalities of pelvic organs, first trimester: Secondary | ICD-10-CM | POA: Insufficient documentation

## 2022-08-19 DIAGNOSIS — O3680X0 Pregnancy with inconclusive fetal viability, not applicable or unspecified: Secondary | ICD-10-CM | POA: Diagnosis not present

## 2022-08-19 LAB — CBC
HCT: 35 % — ABNORMAL LOW (ref 36.0–46.0)
Hemoglobin: 12.1 g/dL (ref 12.0–15.0)
MCH: 29.5 pg (ref 26.0–34.0)
MCHC: 34.6 g/dL (ref 30.0–36.0)
MCV: 85.4 fL (ref 80.0–100.0)
Platelets: 294 10*3/uL (ref 150–400)
RBC: 4.1 MIL/uL (ref 3.87–5.11)
RDW: 12.4 % (ref 11.5–15.5)
WBC: 6.5 10*3/uL (ref 4.0–10.5)
nRBC: 0 % (ref 0.0–0.2)

## 2022-08-19 LAB — WET PREP, GENITAL
Clue Cells Wet Prep HPF POC: NONE SEEN
Sperm: NONE SEEN
Trich, Wet Prep: NONE SEEN
WBC, Wet Prep HPF POC: >=10 — AB (ref <10)
Yeast Wet Prep HPF POC: NONE SEEN

## 2022-08-19 LAB — URINALYSIS, ROUTINE W REFLEX MICROSCOPIC
Bilirubin Urine: NEGATIVE
Glucose, UA: NEGATIVE mg/dL
Hgb urine dipstick: NEGATIVE
Ketones, ur: NEGATIVE mg/dL
Leukocytes,Ua: NEGATIVE
Nitrite: NEGATIVE
Protein, ur: NEGATIVE mg/dL
Specific Gravity, Urine: 1.013 (ref 1.005–1.030)
pH: 5 (ref 5.0–8.0)

## 2022-08-19 LAB — HCG, QUANTITATIVE, PREGNANCY: hCG, Beta Chain, Quant, S: 290 m[IU]/mL — ABNORMAL HIGH (ref <5)
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
## 2022-08-19 NOTE — MAU Note (Signed)
Rachael Cook is a 22 y.o. at [redacted]w[redacted]d here in MAU reporting: been bleeding for 11 days, brown spotting.  Mom had told her it was implantation.  She started looking at stuff on google, has hx of SAB, so she got worried and came in .mild cramping.  No recent intercourse. LMP: 2/8 Onset of complaint: 11days Pain score: mild Vitals:   08/19/22 0859  BP: 114/82  Pulse: (!) 111  Resp: 17  Temp: 98.3 F (36.8 C)  SpO2: 100%     Lab orders placed from triage:  urine

## 2022-08-19 NOTE — MAU Provider Note (Signed)
History     CSN: [id number]  Arrival date and time: 08/19/22 [id number]   Event Date/Time   First Provider Initiated Contact with Patient 08/19/22 0940      Chief Complaint  Patient presents with   Abdominal Pain   Vaginal Bleeding   HPI Rachael Cook is a 22 y.o. G2P0010 at [redacted]w[redacted]d by LMP who presents to MAU for vaginal spotting. She reports light pink/brown spotting for 11 days. She denies pain, itching, odor, or urinary s/s. No recent intercourse or pelvic exams. LMP 07/08/2022.  Patient is scheduled for OB appointment at [facility] on 08/31/2022.  OB History     Gravida  2   Para  0   Term  0   Preterm  0   AB  1   Living  0      SAB  1   IAB  0   Ectopic  0   Multiple  0   Live Births  0           Past Medical History:  Diagnosis Date   Sickle cell trait ([place])     Past Surgical History:  Procedure Laterality Date   ORIF ANKLE FRACTURE Right 01/30/2020   Procedure: OPEN REDUCTION INTERNAL FIXATION (ORIF) RIGHT ANKLE FRACTURE;  Surgeon: Newt Minion, MD;  Location: [facility];  Service: Orthopedics;  Laterality: Right;   WISDOM TOOTH EXTRACTION      Family History  Problem Relation Age of Onset   Cancer - Colon Maternal Grandmother     Social History   Tobacco Use   Smoking status: Never    Passive exposure: Never   Smokeless tobacco: Never  Vaping Use   Vaping Use: Never used  Substance Use Topics   Alcohol use: No   Drug use: No    Allergies: No Known Allergies  No medications prior to admission.   Review of Systems  Constitutional: Negative.   Genitourinary:  Positive for vaginal bleeding (spotting).  All other systems reviewed and are negative.  Physical Exam   Blood pressure 114/82, pulse (!) 111, temperature 98.3 F (36.8 C), temperature source Oral, resp. rate 17, height 5\' 4"  (1.626 m), weight 61.1 kg, last menstrual period 07/08/2022, SpO2 100 %.  Physical Exam Vitals and nursing note reviewed.  Constitutional:       General: She is not in acute distress. Eyes:     Extraocular Movements: Extraocular movements intact.  Cardiovascular:     Rate and Rhythm: Tachycardia present.  Pulmonary:     Effort: Pulmonary effort is normal. No respiratory distress.  Abdominal:     Palpations: Abdomen is soft.     Tenderness: There is no abdominal tenderness.  Genitourinary:    Comments: Patient self-swabbed Skin:    General: Skin is warm and dry.  Neurological:     General: No focal deficit present.     Mental Status: She is alert and oriented to person, place, and time.  Psychiatric:        Mood and Affect: Mood normal.        Behavior: Behavior normal.    Results for orders placed or performed during the hospital encounter of 08/19/22 (from the past 24 hour(s))  Urinalysis, Routine w reflex microscopic -Urine, Clean Catch     Status: None   Collection Time: 08/19/22  9:30 AM  Result Value Ref Range   Color, Urine YELLOW YELLOW   APPearance CLEAR CLEAR   Specific Gravity, Urine 1.013 1.005 - 1.030  pH 5.0 5.0 - 8.0   Glucose, UA NEGATIVE NEGATIVE mg/dL   Hgb urine dipstick NEGATIVE NEGATIVE   Bilirubin Urine NEGATIVE NEGATIVE   Ketones, ur NEGATIVE NEGATIVE mg/dL   Protein, ur NEGATIVE NEGATIVE mg/dL   Nitrite NEGATIVE NEGATIVE   Leukocytes,Ua NEGATIVE NEGATIVE  CBC     Status: Abnormal   Collection Time: 08/19/22  9:41 AM  Result Value Ref Range   WBC 6.5 4.0 - 10.5 K/uL   RBC 4.10 3.87 - 5.11 MIL/uL   Hemoglobin 12.1 12.0 - 15.0 g/dL   HCT 35.0 (L) 36.0 - 46.0 %   MCV 85.4 80.0 - 100.0 fL   MCH 29.5 26.0 - 34.0 pg   MCHC 34.6 30.0 - 36.0 g/dL   RDW 12.4 11.5 - 15.5 %   Platelets 294 150 - 400 K/uL   nRBC 0.0 0.0 - 0.2 %  hCG, quantitative, pregnancy     Status: Abnormal   Collection Time: 08/19/22  9:41 AM  Result Value Ref Range   hCG, Beta Chain, Quant, S 290 (H) <5 mIU/mL  Wet prep, genital     Status: Abnormal   Collection Time: 08/19/22  9:59 AM   Specimen: Vaginal  Result Value  Ref Range   Yeast Wet Prep HPF POC NONE SEEN NONE SEEN   Trich, Wet Prep NONE SEEN NONE SEEN   Clue Cells Wet Prep HPF POC NONE SEEN NONE SEEN   WBC, Wet Prep HPF POC >=10 (A) <10   Sperm NONE SEEN    US OB LESS THAN 14 WEEKS WITH OB TRANSVAGINAL  Result Date: 08/19/2022 CLINICAL DATA:  Vaginal bleeding.  Pregnant. EXAM: OBSTETRIC <14 WK [place] AND TRANSVAGINAL OB US TECHNIQUE: Both transabdominal and transvaginal ultrasound examinations were performed for complete evaluation of the gestation as well as the maternal uterus, adnexal regions, and pelvic cul-de-sac. Transvaginal technique was performed to assess early pregnancy. COMPARISON:  January 11, 2022 ultrasound FINDINGS: Intrauterine gestational sac: None Yolk sac:  Not Visualized. Embryo:  Not Visualized. Cardiac Activity: Not Visualized. Maternal uterus/adnexae: Slightly heterogeneous myometrium. The endometrial stripe is also somewhat heterogeneous. Thickness of up to 5 mm. Both ovaries have small follicles. The right ovary measures 2.5 x 1.6 1.5 cm. Left 2.5 by 3.6 x 2.8 cm. There is a septated cyst in the left ovary measuring 2.4 x 1.8 x 2.2 cm. Slightly nodular as well. No significant free fluid. IMPRESSION: No intrauterine pregnancy identified. No free fluid. However, in the presence of a positive pregnancy test and no IUP, ectopic can not be entirely excluded recommend close follow-up with serial beta HCG and ultrasound. Complex left ovarian cystic lesion measuring 2.4 cm. With the complexity recommend follow up in 6-12 weeks. Electronically Signed   By: Jill Side M.D.   On: 08/19/2022 10:22    MAU Course  Procedures  MDM UA CBC, HCG Wet prep, GC/CT [place]  UA and wet prep negative. Blood type O positive, Rhogam not indicated. Labs reassuring. HCG 290. [place] without evidence of IUP or ectopic. Reviewed with patient normal early IUP vs ectopic pregnancy vs failed pregnancy.  Assessment and Plan   1. Pregnancy of unknown anatomic location    2. Vaginal spotting    - Discharge home in stable condition - Return precautions given.  - Return to MAU in 48 hours for repeat bHCG. Return sooner for new/worsening symptoms  Renee Harder, CNM 08/19/2022, 12:00 PM

## 2022-08-20 ENCOUNTER — Other Ambulatory Visit: Payer: Self-pay

## 2022-08-20 ENCOUNTER — Telehealth: Payer: Self-pay

## 2022-08-20 LAB — GC/CHLAMYDIA PROBE AMP (~~LOC~~) NOT AT ARMC
Chlamydia: POSITIVE — AB
Comment: NEGATIVE
Comment: NORMAL
Neisseria Gonorrhea: NEGATIVE

## 2022-08-20 MED ORDER — AZITHROMYCIN 250 MG PO TABS: 1000.0000 mg | ORAL_TABLET | Freq: Once | ORAL | 0 refills | Status: AC

## 2022-08-20 NOTE — Telephone Encounter (Signed)
Attempted to contact patient regarding positive chlamydia results. Number is not in service. Will send Mychart message.

## 2022-08-21 ENCOUNTER — Other Ambulatory Visit: Payer: Self-pay

## 2022-08-21 ENCOUNTER — Inpatient Hospital Stay (HOSPITAL_COMMUNITY)
Admit: 2022-08-21 | Discharge: 2022-08-21 | Disposition: A | Discharge status: Home / Self Care | Payer: Self-pay | Attending: Obstetrics & Gynecology | Admitting: Obstetrics & Gynecology

## 2022-08-21 ENCOUNTER — Encounter (HOSPITAL_COMMUNITY): Payer: Self-pay | Admitting: Obstetrics & Gynecology

## 2022-08-21 ENCOUNTER — Inpatient Hospital Stay (HOSPITAL_COMMUNITY)
Admission: AD | Admit: 2022-08-21 | Discharge: 2022-08-21 | Disposition: A | Discharge status: Home / Self Care | Payer: Medicaid Other | Attending: Obstetrics & Gynecology | Admitting: Obstetrics & Gynecology

## 2022-08-21 DIAGNOSIS — Z3A01 Less than 8 weeks gestation of pregnancy: Secondary | ICD-10-CM | POA: Diagnosis not present

## 2022-08-21 DIAGNOSIS — O209 Hemorrhage in early pregnancy, unspecified: Secondary | ICD-10-CM | POA: Insufficient documentation

## 2022-08-21 DIAGNOSIS — O3680X0 Pregnancy with inconclusive fetal viability, not applicable or unspecified: Secondary | ICD-10-CM | POA: Insufficient documentation

## 2022-08-21 DIAGNOSIS — A749 Chlamydial infection, unspecified: Secondary | ICD-10-CM | POA: Diagnosis not present

## 2022-08-21 LAB — HCG, QUANTITATIVE, PREGNANCY: hCG, Beta Chain, Quant, S: 346 m[IU]/mL — ABNORMAL HIGH (ref <5)

## 2022-08-21 NOTE — MAU Note (Signed)
.  Rachael Cook is a 22 y.o. at [redacted]w[redacted]d here in MAU reporting: she's here for repeat HCG level.  Endorses "period cramps"  and spotting with wiping. LMP: NA Onset of complaint: 11 days ago Pain score: 3 Vitals:   08/21/22 1005  BP: 113/68  Pulse: (!) 103  Resp: 18  Temp: 99.1 F (37.3 C)  SpO2: 100%     FHT:NA Lab orders placed from triage:   HCG Level

## 2022-08-21 NOTE — MAU Provider Note (Signed)
History    [id number] Arrival date and time: 08/21/22 0906    Chief Complaint  Patient presents with   HCG Level   HPI Rachael Cook is a 22 y.o. at 107w2d by LMP who was seen here 2 days ago with abdominal pain and vaginal bleeding. She is here for a follow-up hCG due to pregnancy of unknown location. Also diagnosed with chlamydia at last visit.  She reports having an episode of vaginal bleeding involving blood clots yesterday.  No bleeding since then.  No new abdominal pain.  She states that she had gotten her mind to MyChart message about the chlamydia, after the midwife tried to reach on telephone.  She has picked up the prescription for azithromycin and has taken it.  She has also told her partner.  --/--/O POS (08/14 1543)  OB History     Gravida  2   Para  0   Term  0   Preterm  0   AB  1   Living  0      SAB  1   IAB  0   Ectopic  0   Multiple  0   Live Births  0           Past Medical History:  Diagnosis Date   Sickle cell trait ([place])     Past Surgical History:  Procedure Laterality Date   ORIF ANKLE FRACTURE Right 01/30/2020   Procedure: OPEN REDUCTION INTERNAL FIXATION (ORIF) RIGHT ANKLE FRACTURE;  Surgeon: Newt Minion, MD;  Location: [place];  Service: Orthopedics;  Laterality: Right;   WISDOM TOOTH EXTRACTION      Family History  Problem Relation Age of Onset   Cancer - Colon Maternal Grandmother     Social History   Socioeconomic History   Marital status: Single    Spouse name: Not on file   Number of children: Not on file   Years of education: Not on file   Highest education level: Not on file  Occupational History   Not on file  Tobacco Use   Smoking status: Never    Passive exposure: Never   Smokeless tobacco: Never  Vaping Use   Vaping Use: Never used  Substance and Sexual Activity   Alcohol use: No   Drug use: No   Sexual activity: Yes    Partners: Male    Birth control/protection: None  Other Topics Concern    Not on file  Social History Narrative   Not on file   Social Determinants of Health   Financial Resource Strain: Not on file  Food Insecurity: Not on file  Transportation Needs: Not on file  Physical Activity: Not on file  Stress: Not on file  Social Connections: Not on file  Intimate Partner Violence: Not on file    No Known Allergies  No current facility-administered medications on file prior to encounter.   No current outpatient medications on file prior to encounter.    Review of Systems  Constitutional:  Negative for chills and fever.  Eyes:  Negative for blurred vision.  Cardiovascular:  Negative for leg swelling.  Gastrointestinal:  Negative for abdominal pain and vomiting.  Genitourinary:  Negative for dysuria, frequency and urgency.  Musculoskeletal:  Negative for myalgias.  Skin:  Negative for itching.  Neurological:  Negative for dizziness.   Pertinent positives and negative per HPI, all others reviewed and negative  Physical Exam   BP 113/68 (BP Location: Right Arm)   Pulse (!) 103  Temp 99.1 F (37.3 C) (Oral)   Resp 18   Ht 5\' 4"  (1.626 m)   Wt 60 kg   LMP 07/08/2022   SpO2 100%   BMI 22.71 kg/m   Patient Vitals for the past 24 hrs:  BP Temp Temp src Pulse Resp SpO2 Height Weight  08/21/22 1005 113/68 99.1 F (37.3 C) Oral (!) 103 18 100 % -- --  08/21/22 1000 -- -- -- -- -- -- 5\' 4"  (1.626 m) 60 kg    Physical Exam Vitals reviewed.  Constitutional:      General: She is not in acute distress.    Appearance: She is well-developed. She is not toxic-appearing.  HENT:     Head: Normocephalic and atraumatic.     Mouth/Throat:     Mouth: Mucous membranes are moist.  Eyes:     Extraocular Movements: Extraocular movements intact.  Cardiovascular:     Rate and Rhythm: Normal rate.  Pulmonary:     Effort: Pulmonary effort is normal. No respiratory distress.  Abdominal:     Palpations: Abdomen is soft.  Skin:    General: Skin is warm and  dry.  Neurological:     Mental Status: She is alert and oriented to person, place, and time.  Psychiatric:        Mood and Affect: Mood normal.        Behavior: Behavior normal.      Labs Results for orders placed or performed during the hospital encounter of 08/21/22 (from the past 24 hour(s))  hCG, quantitative, pregnancy     Status: Abnormal   Collection Time: 08/21/22 10:45 AM  Result Value Ref Range   hCG, Beta Chain, Quant, S 346 (H) <5 mIU/mL    Imaging No results found.  MAU Course  Procedures Lab Orders         hCG, quantitative, pregnancy         Beta hCG quant (ref lab)    No orders of the defined types were placed in this encounter.  Imaging Orders  No imaging studies ordered today    MDM moderate  Assessment and Plan  Pregnancy, location unknown - Plan: Beta hCG quant (ref lab)  Chlamydia infection  22 year old G2, P0, with pregnancy of unknown location, following up for beta-hCG. She had a very slight increase in beta-hCG, which is less than the expected doubling in 48 hours.  This is suspicious for a failing pregnancy, in addition to vaginal bleeding she has experienced.  However, not definitive.  Recommend repeat beta-hCG in 48 hours to reassess -appointment set up to have labs drawn at [facility].  Chlamydia infection-has taking 1 g azithromycin as prescribed.  Encouraged her to inform partner(s) so they can get treated also.  Abstain from sex until partner(s) are treated.  Dispo: discharged to home in stable condition.   Allergies as of 08/21/2022   No Known Allergies      Medication List     STOP taking these medications    cephALEXin 500 MG capsule Commonly known as: KEFLEX   promethazine 25 MG tablet Commonly known as: PHENERGAN       Liliane Channel MD MPH OB Fellow, [place] [facility] 08/21/2022

## 2022-08-21 NOTE — Discharge Instructions (Signed)
[facility]25 to get another lab draw.  Please return if you have any other concerns.

## 2022-08-23 ENCOUNTER — Ambulatory Visit (INDEPENDENT_AMBULATORY_CARE_PROVIDER_SITE_OTHER): Payer: Medicaid Other

## 2022-08-23 VITALS — BP 102/59 | HR 90 | Ht 64.0 in | Wt 137.3 lb

## 2022-08-23 DIAGNOSIS — Z3A01 Less than 8 weeks gestation of pregnancy: Secondary | ICD-10-CM

## 2022-08-23 DIAGNOSIS — O3680X0 Pregnancy with inconclusive fetal viability, not applicable or unspecified: Secondary | ICD-10-CM

## 2022-08-23 LAB — BETA HCG QUANT (REF LAB): hCG Quant: 301 m[IU]/mL

## 2022-08-23 NOTE — Progress Notes (Signed)
Pt here today for STAT beta s/p pregnancy of unknown location.  Pt denies pain and his having bloody show.  Pt advised that we will call by end of day with results.  Pt verbalized understanding.   Received beta results of 301.  Reveiwed chart with Dr. Rip Harbour who recommends that she have an U/S on 08/31/22. Notified pt results and that this may not be a good pregnancy.  I informed pt that the provider recommends a viability scan on 08/31/22 and if she would be able to come in at 1100.  Pt confirmed.  Pt advised when to go to MAU for evaluation.  Pt verbalized understanding with no further questions.    Frances Nickels

## 2022-08-30 DIAGNOSIS — Z419 Encounter for procedure for purposes other than remedying health state, unspecified: Secondary | ICD-10-CM | POA: Diagnosis not present

## 2022-08-31 ENCOUNTER — Ambulatory Visit (INDEPENDENT_AMBULATORY_CARE_PROVIDER_SITE_OTHER): Payer: Medicaid Other

## 2022-08-31 ENCOUNTER — Inpatient Hospital Stay (HOSPITAL_COMMUNITY)
Admission: AD | Admit: 2022-08-31 | Discharge: 2022-08-31 | Disposition: A | Discharge status: Home / Self Care | Payer: Medicaid Other | Attending: Obstetrics and Gynecology | Admitting: Obstetrics and Gynecology

## 2022-08-31 ENCOUNTER — Ambulatory Visit (INDEPENDENT_AMBULATORY_CARE_PROVIDER_SITE_OTHER): Payer: Medicaid Other | Admitting: Advanced Practice Midwife

## 2022-08-31 DIAGNOSIS — Z3A01 Less than 8 weeks gestation of pregnancy: Secondary | ICD-10-CM

## 2022-08-31 DIAGNOSIS — N83299 Other ovarian cyst, unspecified side: Secondary | ICD-10-CM | POA: Insufficient documentation

## 2022-08-31 DIAGNOSIS — O00102 Left tubal pregnancy without intrauterine pregnancy: Secondary | ICD-10-CM | POA: Diagnosis not present

## 2022-08-31 DIAGNOSIS — O0281 Inappropriate change in quantitative human chorionic gonadotropin (hCG) in early pregnancy: Secondary | ICD-10-CM | POA: Diagnosis not present

## 2022-08-31 DIAGNOSIS — O3680X0 Pregnancy with inconclusive fetal viability, not applicable or unspecified: Secondary | ICD-10-CM

## 2022-08-31 LAB — CBC
HCT: 34.3 % — ABNORMAL LOW (ref 36.0–46.0)
Hemoglobin: 11.8 g/dL — ABNORMAL LOW (ref 12.0–15.0)
MCH: 29.5 pg (ref 26.0–34.0)
MCHC: 34.4 g/dL (ref 30.0–36.0)
MCV: 85.8 fL (ref 80.0–100.0)
Platelets: 247 10*3/uL (ref 150–400)
RBC: 4 MIL/uL (ref 3.87–5.11)
RDW: 12.4 % (ref 11.5–15.5)
WBC: 7 10*3/uL (ref 4.0–10.5)
nRBC: 0 % (ref 0.0–0.2)

## 2022-08-31 LAB — COMPREHENSIVE METABOLIC PANEL
ALT: 23 U/L (ref 0–44)
AST: 25 U/L (ref 15–41)
Albumin: 3.8 g/dL (ref 3.5–5.0)
Alkaline Phosphatase: 79 U/L (ref 38–126)
Anion gap: 8 (ref 5–15)
BUN: 11 mg/dL (ref 6–20)
CO2: 26 mmol/L (ref 22–32)
Calcium: 9.4 mg/dL (ref 8.9–10.3)
Chloride: 101 mmol/L (ref 98–111)
Creatinine, Ser: 0.76 mg/dL (ref 0.44–1.00)
GFR, Estimated: >60 mL/min (ref >60)
Glucose, Bld: 98 mg/dL (ref 70–99)
Potassium: 4.4 mmol/L (ref 3.5–5.1)
Sodium: 135 mmol/L (ref 135–145)
Total Bilirubin: 0.4 mg/dL (ref 0.3–1.2)
Total Protein: 7.4 g/dL (ref 6.5–8.1)

## 2022-08-31 LAB — HCG, QUANTITATIVE, PREGNANCY: hCG, Beta Chain, Quant, S: 592 m[IU]/mL — ABNORMAL HIGH (ref <5)

## 2022-08-31 MED ORDER — METHOTREXATE FOR ECTOPIC PREGNANCY
50.0000 mg/m2 | Freq: Once | INTRAMUSCULAR | Status: AC
  Administered 2022-08-31: 85 mg via INTRAMUSCULAR
  Filled 2022-08-31: qty 3.4

## 2022-08-31 NOTE — MAU Note (Signed)
...  Rachael Cook is a 22 y.o. at [redacted]w[redacted]d here in MAU reporting: Sent here for MTX injection. She was told she has an ectopic pregnancy. Denies VB. Denies pain.   LMP: 07/08/2022  Pain score: Denies pain.  Vitals:   08/31/22 1339  BP: 108/60  Pulse: 93  Resp: 15  Temp: 99.3 F (37.4 C)  SpO2: 99%     Lab orders placed from triage:  none - NP placed orders

## 2022-08-31 NOTE — Progress Notes (Signed)
Ultrasounds Results Note  SUBJECTIVE HPI:  [name] is a 22 y.o. G2P0010 at [redacted]w[redacted]d by LMP who presents to [facility] for followup ultrasound results. The patient denies abdominal pain or vaginal bleeding.  Upon review of the patient's records, patient was first seen in MAU on 08/18/32 for spotting.     Previous HCG's  Latest Reference Range & Units 08/03/22 11:57 08/19/22 09:41 08/21/22 10:45 08/23/22 10:05  hCG Quant mIU/mL    301  HCG, Beta Chain, Quant, S <5 mIU/mL 36 (H) 290 (H) 346 (H)   (H): Data is abnormally high  Previous US IMPRESSION: No intrauterine pregnancy identified. No free fluid. However, in the presence of a positive pregnancy test and no IUP, ectopic can not be entirely excluded recommend close follow-up with serial beta HCG and ultrasound.   Complex left ovarian cystic lesion measuring 2.4 cm. With the complexity recommend follow up in 6-12 weeks.  --/--/O POS (08/14 1543)  Repeat ultrasound was performed earlier today.   Past Medical History:  Diagnosis Date   Sickle cell trait [facility])    Past Surgical History:  Procedure Laterality Date   ORIF ANKLE FRACTURE Right 01/30/2020   Procedure: OPEN REDUCTION INTERNAL FIXATION (ORIF) RIGHT ANKLE FRACTURE;  Surgeon: Newt Minion, MD;  Location: [place];  Service: Orthopedics;  Laterality: Right;   WISDOM TOOTH EXTRACTION     Social History   Socioeconomic History   Marital status: Single    Spouse name: Not on file   Number of children: Not on file   Years of education: Not on file   Highest education level: Not on file  Occupational History   Not on file  Tobacco Use   Smoking status: Never    Passive exposure: Never   Smokeless tobacco: Never  Vaping Use   Vaping Use: Never used  Substance and Sexual Activity   Alcohol use: No   Drug use: No   Sexual activity: Yes    Partners: Male    Birth control/protection: None  Other Topics Concern   Not on file  Social History  Narrative   Not on file   Social Determinants of Health   Financial Resource Strain: Not on file  Food Insecurity: Not on file  Transportation Needs: Not on file  Physical Activity: Not on file  Stress: Not on file  Social Connections: Not on file  Intimate Partner Violence: Not on file   No current outpatient medications on file prior to visit.   No current facility-administered medications on file prior to visit.   No Known Allergies  I have reviewed patient's Past Medical Hx, Surgical Hx, Family Hx, Social Hx, medications and allergies.   Review of Systems Review of Systems  Constitutional: Negative for fever and chills.  Gastrointestinal: Negative for abdominal pain.  Genitourinary: Negative for vaginal bleeding.  Musculoskeletal: Negative for back pain.  Neurological: Negative for dizziness and weakness.    Physical Exam  LMP 07/08/2022   Patient's last menstrual period was 07/08/2022. GENERAL: Well-developed, well-nourished female in no acute distress.  HEENT: Normocephalic, atraumatic.   LUNGS: Effort normal ABDOMEN: Deferred HEART: Regular rate  SKIN: Warm, dry and without erythema PSYCH: Normal mood and affect NEURO: Alert and oriented x 4  LAB RESULTS No results found for this or any previous visit (from the past 24 hour(s)).  IMAGING 2.0 x 1.6 cm mass in left adnexa suspicious for ectopic pregnancy. No free fluid.   ASSESSMENT 1. Left tubal pregnancy  without intrauterine pregnancy    1. Left tubal pregnancy without intrauterine pregnancy - Discussed Hx, labs, [place] w/ Dr. Elly Modena. - Instructed to go directly to MAU for lab work and possible MTX. Explained that liver enzymes were elevated in early March and would need to checked today. May be a barrier to MTX.  - Support given  2.  Inappropriate change in quantitative hCG in early pregnancy - C/W failed pregnancy/ectopic   PLAN Go to MAU Patient advised to discontinue taking prenatal vitamins and  folic acid.  Go to MAU as needed for heavy bleeding, abdominal pain or fever greater than 100.4.  [place], [place] 08/31/2022 12:07 PM

## 2022-08-31 NOTE — MAU Provider Note (Signed)
Subjective:  Rachael Cook is a 22 y.o. G2P0010 at [redacted]w[redacted]d who presents today for MTX for pregnancy of unknown location, presumed ectopic. She was seen in the office today and f/u US shows no evidence of IUP, and possible ectopic pregnancy. She has had abnormal rising Quants since 3/21. She denies vaginal bleeding. She denies abdominal or pelvic pain.   Quant 3/21: 290 Quant 3/23: 346 Quant 3/25: 301 Quant 4/2: [place]  Per Dr. Elly Modena the patient should be offered MTX.    Objective:  Physical Exam  Nursing note and vitals reviewed. Constitutional: She is oriented to person, place, and time. She appears well-developed and well-nourished. No distress.  HENT:  Head: Normocephalic.  Neurological: She is alert and oriented to person, place, and time. Skin: Skin is warm and dry.  Psychiatric: She has a normal mood and affect.   Results for orders placed or performed during the hospital encounter of 08/31/22 (from the past 24 hour(s))  CBC     Status: Abnormal   Collection Time: 08/31/22 12:35 PM  Result Value Ref Range   WBC 7.0 4.0 - 10.5 K/uL   RBC 4.00 3.87 - 5.11 MIL/uL   Hemoglobin 11.8 (L) 12.0 - 15.0 g/dL   HCT 34.3 (L) 36.0 - 46.0 %   MCV 85.8 80.0 - 100.0 fL   MCH 29.5 26.0 - 34.0 pg   MCHC 34.4 30.0 - 36.0 g/dL   RDW 12.4 11.5 - 15.5 %   Platelets 247 150 - 400 K/uL   nRBC 0.0 0.0 - 0.2 %  Comprehensive metabolic panel     Status: None   Collection Time: 08/31/22 12:35 PM  Result Value Ref Range   Sodium 135 135 - 145 mmol/L   Potassium 4.4 3.5 - 5.1 mmol/L   Chloride 101 98 - 111 mmol/L   CO2 26 22 - 32 mmol/L   Glucose, Bld 98 70 - 99 mg/dL   BUN 11 6 - 20 mg/dL   Creatinine, Ser 0.76 0.44 - 1.00 mg/dL   Calcium 9.4 8.9 - 10.3 mg/dL   Total Protein 7.4 6.5 - 8.1 g/dL   Albumin 3.8 3.5 - 5.0 g/dL   AST 25 15 - 41 U/L   ALT 23 0 - 44 U/L   Alkaline Phosphatase 79 38 - 126 U/L   Total Bilirubin 0.4 0.3 - 1.2 mg/dL   GFR, Estimated >60 >60 mL/min   Anion gap  8 5 - 15  hCG, quantitative, pregnancy     Status: Abnormal   Collection Time: 08/31/22 12:35 PM  Result Value Ref Range   hCG, Beta Chain, Quant, S 592 (H) <5 mIU/mL   CMP/CBC: no acute findings.     A:  1. Pregnancy of unknown anatomic location      P:  Status post MTX day 1 Follow up Quant scheduled for 0930 on Friday 4/5 Ectopic precautions Ok to use ibuprofen/tylenol as needed for pain Return to MAU if symptoms worsen Pelvic rest  Anjenette Gerbino, Artist Pais, NP 08/31/2022 8:41 PM

## 2022-09-01 ENCOUNTER — Ambulatory Visit: Payer: Self-pay | Admitting: Family Medicine

## 2022-09-03 ENCOUNTER — Telehealth: Payer: Self-pay | Admitting: *Deleted

## 2022-09-03 ENCOUNTER — Ambulatory Visit: Payer: Medicaid Other

## 2022-09-03 ENCOUNTER — Encounter: Payer: Self-pay | Admitting: *Deleted

## 2022-09-03 NOTE — Telephone Encounter (Signed)
Rachael Cook [facility] stat bhcg for day 4 after MTX. I called and left a message notifying her she missed a very important appointment and that we are closed in the afternoon and we recommend she go to the hospital for evaluation and that I would send detailed mychart message.  [name]

## 2022-09-06 ENCOUNTER — Other Ambulatory Visit: Payer: Self-pay

## 2022-09-06 ENCOUNTER — Inpatient Hospital Stay (HOSPITAL_COMMUNITY)
Admission: AD | Admit: 2022-09-06 | Discharge: 2022-09-06 | Disposition: A | Discharge status: Home / Self Care | Payer: Medicaid Other | Attending: Obstetrics and Gynecology | Admitting: Obstetrics and Gynecology

## 2022-09-06 DIAGNOSIS — O3680X0 Pregnancy with inconclusive fetal viability, not applicable or unspecified: Secondary | ICD-10-CM | POA: Diagnosis not present

## 2022-09-06 DIAGNOSIS — Z3A08 8 weeks gestation of pregnancy: Secondary | ICD-10-CM

## 2022-09-06 LAB — CBC
HCT: 31.1 % — ABNORMAL LOW (ref 36.0–46.0)
Hemoglobin: 10.9 g/dL — ABNORMAL LOW (ref 12.0–15.0)
MCH: 29.5 pg (ref 26.0–34.0)
MCHC: 35 g/dL (ref 30.0–36.0)
MCV: 84.3 fL (ref 80.0–100.0)
Platelets: 240 10*3/uL (ref 150–400)
RBC: 3.69 MIL/uL — ABNORMAL LOW (ref 3.87–5.11)
RDW: 12.5 % (ref 11.5–15.5)
WBC: 4 10*3/uL (ref 4.0–10.5)
nRBC: 0 % (ref 0.0–0.2)

## 2022-09-06 LAB — COMPREHENSIVE METABOLIC PANEL
ALT: 19 U/L (ref 0–44)
AST: 20 U/L (ref 15–41)
Albumin: 3.7 g/dL (ref 3.5–5.0)
Alkaline Phosphatase: 64 U/L (ref 38–126)
Anion gap: 7 (ref 5–15)
BUN: 11 mg/dL (ref 6–20)
CO2: 25 mmol/L (ref 22–32)
Calcium: 9.1 mg/dL (ref 8.9–10.3)
Chloride: 105 mmol/L (ref 98–111)
Creatinine, Ser: 0.75 mg/dL (ref 0.44–1.00)
GFR, Estimated: >60 mL/min (ref >60)
Glucose, Bld: 98 mg/dL (ref 70–99)
Potassium: 4 mmol/L (ref 3.5–5.1)
Sodium: 137 mmol/L (ref 135–145)
Total Bilirubin: 0.4 mg/dL (ref 0.3–1.2)
Total Protein: 7 g/dL (ref 6.5–8.1)

## 2022-09-06 LAB — HCG, QUANTITATIVE, PREGNANCY: hCG, Beta Chain, Quant, S: 396 m[IU]/mL — ABNORMAL HIGH (ref <5)

## 2022-09-06 NOTE — MAU Note (Addendum)
Rachael Cook is a 22 y.o. at [redacted]w[redacted]d here in MAU reporting: she's here for follow up blood work.  Reports missed appt on Friday instructed to have done in MAU today.  Endorses abdominal cramping and VB.  Reports VB began 2 days ago, similar to menstrual cycle. LMP: NA Onset of complaint: 1-2 weeks ago Pain score: 4 Vitals:   09/06/22 0940  BP: 92/64  Pulse: 95  Resp: 18  Temp: 98.7 F (37.1 C)  SpO2: 96%     FHT:NA Lab orders placed from triage:   HCG

## 2022-09-06 NOTE — MAU Provider Note (Signed)
   S [name] is a 22 y.o. G2P0010 patient who presents to MAU today for repeat HCG level. She received MTX on 4/2 due to inappropriately rising HCG and concerns for ectopic pregnancy. She had an emergency on Friday and was unable to get the hormone level at that time. She is having some cramping and vaginal bleeding like she is on her period.   O BP 92/64 (BP Location: Right Arm)   Pulse 95   Temp 98.7 F (37.1 C) (Oral)   Resp 18   Ht 5\' 4"  (1.626 m)   Wt 65.8 kg   LMP 07/08/2022   SpO2 96%   BMI 24.91 kg/m  Physical Exam Vitals and nursing note reviewed.  Constitutional:      Appearance: Normal appearance.  Cardiovascular:     Rate and Rhythm: Normal rate.  Pulmonary:     Effort: Pulmonary effort is normal.  Abdominal:     General: Abdomen is flat. There is no distension.     Palpations: Abdomen is soft.     Tenderness: There is abdominal tenderness (mild lower abdominal tenderness). There is no guarding or rebound.  Neurological:     Mental Status: She is alert.  Psychiatric:        Mood and Affect: Mood normal.        Behavior: Behavior normal.        Thought Content: Thought content normal.    4/2 - HCG 592 4/8 - HCG 396  A Medical screening exam complete 1. Pregnancy, location unknown    P Discharge from MAU in stable condition Good drop in HCG. While this is not ideal (no comparison to a Day #4 HCG), pain is minimal. Will check  in 1 week for non-stat HCG.  Warning signs for worsening condition that would warrant emergency follow-up discussed Patient may return to MAU as needed   Levie Heritage, DO 09/06/2022 9:48 AM

## 2022-09-13 ENCOUNTER — Ambulatory Visit: Payer: Medicaid Other

## 2022-09-13 ENCOUNTER — Other Ambulatory Visit: Payer: Self-pay

## 2022-09-13 DIAGNOSIS — O3680X0 Pregnancy with inconclusive fetal viability, not applicable or unspecified: Secondary | ICD-10-CM

## 2022-09-14 LAB — BETA HCG QUANT (REF LAB): hCG Quant: 122 m[IU]/mL

## 2022-09-28 ENCOUNTER — Encounter (HOSPITAL_BASED_OUTPATIENT_CLINIC_OR_DEPARTMENT_OTHER): Payer: Self-pay | Admitting: Emergency Medicine

## 2022-09-28 ENCOUNTER — Other Ambulatory Visit: Payer: Self-pay

## 2022-09-28 ENCOUNTER — Encounter: Payer: Self-pay | Admitting: Obstetrics and Gynecology

## 2022-09-28 ENCOUNTER — Other Ambulatory Visit (HOSPITAL_BASED_OUTPATIENT_CLINIC_OR_DEPARTMENT_OTHER): Payer: Self-pay

## 2022-09-28 ENCOUNTER — Emergency Department (HOSPITAL_BASED_OUTPATIENT_CLINIC_OR_DEPARTMENT_OTHER)
Admission: EM | Admit: 2022-09-28 | Discharge: 2022-09-28 | Disposition: A | Discharge status: Home / Self Care | Payer: Medicaid Other | Attending: Emergency Medicine | Admitting: Emergency Medicine

## 2022-09-28 DIAGNOSIS — Z202 Contact with and (suspected) exposure to infections with a predominantly sexual mode of transmission: Secondary | ICD-10-CM | POA: Diagnosis not present

## 2022-09-28 DIAGNOSIS — N898 Other specified noninflammatory disorders of vagina: Secondary | ICD-10-CM | POA: Diagnosis not present

## 2022-09-28 DIAGNOSIS — N939 Abnormal uterine and vaginal bleeding, unspecified: Secondary | ICD-10-CM

## 2022-09-28 DIAGNOSIS — L292 Pruritus vulvae: Secondary | ICD-10-CM | POA: Diagnosis present

## 2022-09-28 LAB — URINALYSIS, W/ REFLEX TO CULTURE (INFECTION SUSPECTED)
Bacteria, UA: NONE SEEN
Bilirubin Urine: NEGATIVE
Glucose, UA: NEGATIVE mg/dL
Ketones, ur: NEGATIVE mg/dL
Nitrite: NEGATIVE
RBC / HPF: >50 RBC/hpf (ref 0–5)
Specific Gravity, Urine: 1.018 (ref 1.005–1.030)
pH: 5.5 (ref 5.0–8.0)

## 2022-09-28 LAB — WET PREP, GENITAL
Clue Cells Wet Prep HPF POC: NONE SEEN
Sperm: NONE SEEN
Trich, Wet Prep: NONE SEEN
WBC, Wet Prep HPF POC: <10 (ref <10)
Yeast Wet Prep HPF POC: NONE SEEN

## 2022-09-28 LAB — HCG, QUANTITATIVE, PREGNANCY
hCG, Beta Chain, Quant, S: 27 m[IU]/mL — ABNORMAL HIGH (ref <5)
hCG, Beta Chain, Quant, S: 26 m[IU]/mL — ABNORMAL HIGH (ref <5)

## 2022-09-28 LAB — PREGNANCY, URINE: Preg Test, Ur: NEGATIVE

## 2022-09-28 LAB — HIV ANTIBODY (ROUTINE TESTING W REFLEX): HIV Screen 4th Generation wRfx: NONREACTIVE

## 2022-09-28 MED ORDER — LIDOCAINE HCL (PF) 1 % IJ SOLN
1.0000 mL | Freq: Once | INTRAMUSCULAR | Status: AC
  Administered 2022-09-28: 1 mL
  Filled 2022-09-28: qty 5

## 2022-09-28 MED ORDER — VALACYCLOVIR HCL 1 G PO TABS
1000.0000 mg | ORAL_TABLET | Freq: Three times a day (TID) | ORAL | 0 refills | Status: DC
  Filled 2022-09-28: qty 21, 7d supply, fill #0

## 2022-09-28 MED ORDER — PENICILLIN G BENZATHINE 1200000 UNIT/2ML IM SUSY
2.4000 10*6.[IU] | PREFILLED_SYRINGE | Freq: Once | INTRAMUSCULAR | Status: AC
  Administered 2022-09-28: 2.4 10*6.[IU] via INTRAMUSCULAR
  Filled 2022-09-28: qty 4

## 2022-09-28 MED ORDER — CEFTRIAXONE SODIUM 500 MG IJ SOLR
500.0000 mg | Freq: Once | INTRAMUSCULAR | Status: AC
  Administered 2022-09-28: 500 mg via INTRAMUSCULAR
  Filled 2022-09-28: qty 500

## 2022-09-28 MED ORDER — DOXYCYCLINE HYCLATE 100 MG PO TABS
100.0000 mg | ORAL_TABLET | Freq: Two times a day (BID) | ORAL | 0 refills | Status: DC
  Filled 2022-09-28: qty 14, 7d supply, fill #0

## 2022-09-28 NOTE — ED Notes (Signed)
Discharge instructions, follow up care, and prescriptions reviewed and explained, pt verbalized understanding and had no further questions on d/c. Pt caox4, ambulatory, NAD on d/c.  

## 2022-09-28 NOTE — Discharge Instructions (Signed)
Your hCG was elevated but this is expected after treatment for an ectopic pregnancy and is persistently downtrending after your previous treatment which is reassuring.  I spoke with on-call OB/GYN who stated that intermittent vaginal bleeding posttreatment is also to be expected.  Given your exposure to chlamydia we will treat you with a 7-day course of doxycycline which has been sent to your pharmacy.  Your labial lesion could be herpes and a swab has been sent and this will be available on the patient portal.  In the meantime we will treat you with Valtrex to cover for that viral infection as well.  Your wet prep was negative for trichomonas, yeast, bacterial vaginosis.  We have also covered you empirically for syphilis given your single vaginal lesion as this can also be an indication of a primary syphilis infection.  The remainder of your testing and results will be available on the patient portal.

## 2022-09-28 NOTE — ED Triage Notes (Signed)
Pt caox4 and ambulatory reporting exposure to chlamydia approx 2 days ago and that yesterday she began having burning upon urination, vaginal itching, an open sore, and spotting.   Recent hx of ectopic pregnancy. Pt reports last menstrual cycle 4/22 but began spotting yesterday.

## 2022-09-28 NOTE — ED Provider Notes (Signed)
[place] EMERGENCY DEPARTMENT AT [facility] Provider Note   CSN: [id number] Arrival date & time: 09/28/22  1012     History  Chief Complaint  Patient presents with   Exposure to STD    Rachael Cook is a 22 y.o. female.  HPI   22 year old female with medical history significant for sickle cell trait, recent diagnosis of ectopic pregnancy who presents to the emergency department with multiple complaints.  The patient states that she was exposed to chlamydia 2 days ago.  Yesterday she started to develop burning upon urination with associated vaginal itching.  She also states that she has developed a single open sore, mildly burning and irritating and itchy on her left labia.  She states that she started spotting yesterday.  She endorses mild left-sided pelvic cramping and discomfort.  She request testing for sexually transmitted infections.  Her last menstrual period was on 4/22 however she has started bleeding again.  Home Medications Prior to Admission medications   Medication Sig Start Date End Date Taking? Authorizing Provider  doxycycline (VIBRA-TABS) 100 MG tablet Take 1 tablet (100 mg total) by mouth 2 (two) times daily. 09/28/22  Yes Ernie Avena, MD  valACYclovir (VALTREX) 1000 MG tablet Take 1 tablet (1,000 mg total) by mouth 3 (three) times daily. 09/28/22  Yes Ernie Avena, MD      Allergies    Patient has no known allergies.    Review of Systems   Review of Systems  Genitourinary:  Positive for genital sores, pelvic pain and vaginal bleeding.  All other systems reviewed and are negative.   Physical Exam Updated Vital Signs BP 102/68 (BP Location: Right Arm)   Pulse 95   Temp 98.8 F (37.1 C) (Oral)   Resp 16   Ht 5\' 4"  (1.626 m)   Wt 65.3 kg   LMP 09/20/2022 (Exact Date)   SpO2 100%   Breastfeeding Unknown   BMI 24.72 kg/m  Physical Exam Vitals and nursing note reviewed. Exam conducted with a chaperone present.  Constitutional:       General: She is not in acute distress.    Appearance: She is well-developed.  HENT:     Head: Normocephalic and atraumatic.  Eyes:     Conjunctiva/sclera: Conjunctivae normal.  Cardiovascular:     Rate and Rhythm: Normal rate and regular rhythm.  Pulmonary:     Effort: Pulmonary effort is normal. No respiratory distress.     Breath sounds: Normal breath sounds.  Abdominal:     Palpations: Abdomen is soft.     Tenderness: There is no abdominal tenderness.  Genitourinary:    Labia:        Left: Lesion present.      Cervix: Cervical motion tenderness and cervical bleeding present.       Comments: Single ulcerative lesion along the patient's left labia, minimal surrounding erythema, no discharge.  Active bleeding noted from the cervix with cervical motion tenderness present, cervical os closed, left sided adnexal tenderness present Musculoskeletal:        General: No swelling.     Cervical back: Neck supple.  Skin:    General: Skin is warm and dry.     Capillary Refill: Capillary refill takes less than 2 seconds.  Neurological:     Mental Status: She is alert.  Psychiatric:        Mood and Affect: Mood normal.     ED Results / Procedures / Treatments   Labs (all labs ordered are listed,  but only abnormal results are displayed) Labs Reviewed  HCG, QUANTITATIVE, PREGNANCY - Abnormal; Notable for the following components:      Result Value   hCG, Beta Chain, Quant, S 27 (*)    All other components within normal limits  URINALYSIS, W/ REFLEX TO CULTURE (INFECTION SUSPECTED) - Abnormal; Notable for the following components:   APPearance HAZY (*)    Hgb urine dipstick LARGE (*)    Protein, ur TRACE (*)    Leukocytes,Ua SMALL (*)    All other components within normal limits  HCG, QUANTITATIVE, PREGNANCY - Abnormal; Notable for the following components:   hCG, Beta Chain, Quant, S 26 (*)    All other components within normal limits  WET PREP, GENITAL  HSV CULTURE AND  TYPING  PREGNANCY, URINE  HIV ANTIBODY (ROUTINE TESTING W REFLEX)  RPR  HSV 1/2 PCR (SURFACE)  HSV 1 ANTIBODY, IGG  HSV 2 ANTIBODY, IGG  GC/CHLAMYDIA PROBE AMP ([place]) NOT AT [facility]    EKG None  Radiology No results found.  Procedures Procedures    Medications Ordered in ED Medications  cefTRIAXone (ROCEPHIN) injection 500 mg (500 mg Intramuscular Given 09/28/22 1114)  lidocaine (PF) (XYLOCAINE) 1 % injection 1-2.1 mL (1 mL Other Given 09/28/22 1114)  penicillin g benzathine (BICILLIN LA) 1200000 UNIT/2ML injection 2.4 Million Units (2.4 Million Units Intramuscular Given 09/28/22 1212)    ED Course/ Medical Decision Making/ A&P                             Medical Decision Making Amount and/or Complexity of Data Reviewed Labs: ordered.  Risk Prescription drug management.    22 year old female with medical history significant for sickle cell trait, recent diagnosis of ectopic pregnancy who presents to the emergency department with multiple complaints.  The patient states that she was exposed to chlamydia 2 days ago.  Yesterday she started to develop burning upon urination with associated vaginal itching.  She also states that she has developed a single open sore, mildly burning and irritating and itchy on her left labia.  She states that she started spotting yesterday.  She endorses mild left-sided pelvic cramping and discomfort.  She request testing for sexually transmitted infections.  Her last menstrual period was on 4/22 however she has started bleeding again.  On arrival, the patient was afebrile, not tachycardic or tachypneic, hemodynamically stable, saturating her percent on room air.  Physical exam significant for a pelvic exam with a single vaginal lesion along the left labia, consistent with ulcerative lesion which could be primary syphilis chancre versus HSV primary outbreak.  Swab of the lesion was obtained and sent for PCR testing.  GC/chlamydia testing was  collected and pending.  The patient had ongoing vaginal bleeding noted on exam.  Differential diagnose includes abnormal uterine bleeding status post ectopic pregnancy and methotrexate treatment, GC/chlamydia infection, less likely ovarian torsion, tubo-ovarian abscess, PID.  Urinalysis was performed which was negative for nitrates, small leukocytes, gross hematuria noted, 0-5 WBCs, no bacteria seen, wet prep was negative for yeast, trichomonas and clue cells.  Spoke with Dr. Despina Hidden, who stated that a continuously downtrending hCG status post methotrexate for her ectopic pregnancy at this stage would still be acceptable.  He also states that intermittent vaginal bleeding post treatment for ectopic is also expected.  He did not feel that the patient needed further workup with ultrasound imaging at this time.  Patient is stable for follow-up with  her OB/GYN.  The patient consented to empiric treatment for STIs.  Will cover for syphilis with penicillin, gonorrhea with IM Rocephin.  Will treat empirically for chlamydia given her known exposure to chlamydia with a course of doxycycline.  Patient  prescribed Valtrex empirically to cover for potential primary HSV outbreak.  Return precautions provided, advised the patient follow-up on the remainder of her results on the patient portal, stable for discharge with follow-up with her OB/GYN.   Final Clinical Impression(s) / ED Diagnoses Final diagnoses:  Vaginal lesion  Exposure to chlamydia  Vaginal bleeding    Rx / DC Orders ED Discharge Orders          Ordered    doxycycline (VIBRA-TABS) 100 MG tablet  2 times daily        09/28/22 1058    valACYclovir (VALTREX) 1000 MG tablet  3 times daily        09/28/22 1316              Ernie Avena, MD 09/28/22 1436

## 2022-09-29 ENCOUNTER — Ambulatory Visit: Payer: Medicaid Other | Admitting: Family Medicine

## 2022-09-29 DIAGNOSIS — Z419 Encounter for procedure for purposes other than remedying health state, unspecified: Secondary | ICD-10-CM | POA: Diagnosis not present

## 2022-09-29 LAB — HSV 1/2 PCR (SURFACE)
HSV-1 DNA: NOT DETECTED
HSV-2 DNA: DETECTED — AB

## 2022-09-29 LAB — GC/CHLAMYDIA PROBE AMP (~~LOC~~) NOT AT ARMC
Chlamydia: NEGATIVE
Comment: NEGATIVE
Comment: NORMAL
Neisseria Gonorrhea: NEGATIVE

## 2022-09-29 LAB — RPR: RPR Ser Ql: NONREACTIVE

## 2022-09-29 LAB — HSV 1 ANTIBODY, IGG: HSV 1 Glycoprotein G Ab, IgG: <0.91 index (ref 0.00–0.90)

## 2022-09-29 LAB — HSV 2 ANTIBODY, IGG: HSV 2 Glycoprotein G Ab, IgG: <0.91 index (ref 0.00–0.90)

## 2022-10-20 ENCOUNTER — Other Ambulatory Visit: Payer: Medicaid Other

## 2022-10-20 DIAGNOSIS — O00102 Left tubal pregnancy without intrauterine pregnancy: Secondary | ICD-10-CM

## 2022-10-21 LAB — BETA HCG QUANT (REF LAB): hCG Quant: <1 m[IU]/mL

## 2022-10-30 DIAGNOSIS — Z419 Encounter for procedure for purposes other than remedying health state, unspecified: Secondary | ICD-10-CM | POA: Diagnosis not present

## 2022-11-26 ENCOUNTER — Ambulatory Visit
Admission: EM | Admit: 2022-11-26 | Discharge: 2022-11-26 | Disposition: A | Discharge status: Home / Self Care | Payer: Medicaid Other | Attending: Emergency Medicine | Admitting: Emergency Medicine

## 2022-11-26 DIAGNOSIS — Z113 Encounter for screening for infections with a predominantly sexual mode of transmission: Secondary | ICD-10-CM | POA: Diagnosis not present

## 2022-11-26 DIAGNOSIS — N760 Acute vaginitis: Secondary | ICD-10-CM | POA: Diagnosis not present

## 2022-11-26 LAB — POCT URINALYSIS DIP (MANUAL ENTRY)
Bilirubin, UA: NEGATIVE
Glucose, UA: NEGATIVE mg/dL
Nitrite, UA: NEGATIVE
Protein Ur, POC: NEGATIVE mg/dL
Spec Grav, UA: 1.02 (ref 1.010–1.025)
Urobilinogen, UA: 0.2 E.U./dL
pH, UA: 6 (ref 5.0–8.0)

## 2022-11-26 LAB — POCT URINE PREGNANCY: Preg Test, Ur: NEGATIVE

## 2022-11-26 NOTE — ED Provider Notes (Signed)
[name]    CSN: [id number] Arrival date & time: 11/26/22  1122    HISTORY   Chief Complaint  Patient presents with   SEXUALLY TRANSMITTED DISEASE   HPI Rachael Cook is a pleasant, 22 y.o. female who presents to urgent care today. Patient complains of abnormal vaginal odor for the past 1 week .  Patient denies abnormal odor of urine, burning with urination, increased urge to urinate, suprapubic pain, perineal pain, flank pain, fever, chills, malaise, rigors, significant fatigue, genital lesion(s), known exposure to STD, and possible exposure to STD.  Patient reports a history of BV, states she has been using boric acid without significant improvement of odor.  Patient is also requesting screening for STD.   The history is provided by the patient.   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 08/31/2022   Closed displaced fracture of medial malleolus of right tibia    Gonorrhea in female 02/01/2019   IUD (intrauterine device) in place 09/25/2018   Chlamydia infection 04/09/2018   Past Surgical History:  Procedure Laterality Date   ORIF ANKLE FRACTURE Right 01/30/2020   Procedure: OPEN REDUCTION INTERNAL FIXATION (ORIF) RIGHT ANKLE FRACTURE;  Surgeon: Nadara Mustard, MD;  Location: [facility] OR;  Service: Orthopedics;  Laterality: Right;   WISDOM TOOTH EXTRACTION     OB History     Gravida  2   Para  0   Term  0   Preterm  0   AB  1   Living  0      SAB  1   IAB  0   Ectopic  0   Multiple  0   Live Births  0          Home Medications    Prior to Admission medications   Medication Sig Start Date End Date Taking? Authorizing Provider  doxycycline (VIBRA-TABS) 100 MG tablet Take 1 tablet (100 mg total) by mouth 2 (two) times daily. 09/28/22   Ernie Avena, MD   valACYclovir (VALTREX) 1000 MG tablet Take 1 tablet (1,000 mg total) by mouth 3 (three) times daily. 09/28/22   Ernie Avena, MD    Family History Family History  Problem Relation Age of Onset   Cancer - Colon Maternal Grandmother    Social History Social History   Tobacco Use   Smoking status: Never    Passive exposure: Never   Smokeless tobacco: Never  Vaping Use   Vaping Use: Never used  Substance Use Topics   Alcohol use: No   Drug use: No   Allergies   Patient has no known allergies.  Review of Systems Review of Systems Pertinent findings revealed after performing a 14 point review of systems has been noted in the history of present illness.  Physical Exam Vital Signs BP 105/71 (BP Location: Left Arm)   Pulse 81   Temp 98.5 F (36.9 C) (Oral)   Resp 18   LMP 11/22/2022 (Approximate)   SpO2 97%   Breastfeeding No   No data found.  Physical Exam Vitals and nursing note reviewed.  Constitutional:      General: She is not in acute distress.    Appearance: Normal appearance. She is not ill-appearing.  HENT:     Head: Normocephalic and atraumatic.  Eyes:     General:  Lids are normal.        Right eye: No discharge.        Left eye: No discharge.     Extraocular Movements: Extraocular movements intact.     Conjunctiva/sclera: Conjunctivae normal.     Right eye: Right conjunctiva is not injected.     Left eye: Left conjunctiva is not injected.  Neck:     Trachea: Trachea and phonation normal.  Cardiovascular:     Rate and Rhythm: Normal rate and regular rhythm.     Pulses: Normal pulses.     Heart sounds: Normal heart sounds. No murmur heard.    No friction rub. No gallop.  Pulmonary:     Effort: Pulmonary effort is normal. No accessory muscle usage, prolonged expiration or respiratory distress.     Breath sounds: Normal breath sounds. No stridor, decreased air movement or transmitted upper airway sounds. No decreased breath sounds, wheezing, rhonchi  or rales.  Chest:     Chest wall: No tenderness.  Genitourinary:    Comments: Patient politely declines pelvic exam today, patient provided a vaginal swab for testing. Musculoskeletal:        General: Normal range of motion.     Cervical back: Normal range of motion and neck supple. Normal range of motion.  Lymphadenopathy:     Cervical: No cervical adenopathy.  Skin:    General: Skin is warm and dry.     Findings: No erythema or rash.  Neurological:     General: No focal deficit present.     Mental Status: She is alert and oriented to person, place, and time.  Psychiatric:        Mood and Affect: Mood normal.        Behavior: Behavior normal.     Visual Acuity Right Eye Distance:   Left Eye Distance:   Bilateral Distance:    Right Eye Near:   Left Eye Near:    Bilateral Near:     UC Couse / Diagnostics / Procedures:     Radiology No results found.  Procedures Procedures (including critical care time) EKG  Pending results:  Labs Reviewed  POCT URINALYSIS DIP (MANUAL ENTRY) - Abnormal; Notable for the following components:      Result Value   Clarity, UA cloudy (*)    Ketones, POC UA trace (5) (*)    Blood, UA moderate (*)    Leukocytes, UA Small (1+) (*)    All other components within normal limits  POCT URINE PREGNANCY  CERVICOVAGINAL ANCILLARY ONLY    Medications Ordered in UC: Medications - No data to display  UC Diagnoses / Final Clinical Impressions(s)   I have reviewed the triage vital signs and the nursing notes.  Pertinent labs & imaging results that were available during my care of the patient were reviewed by me and considered in my medical decision making (see chart for details).    Final diagnoses:  Vaginitis and vulvovaginitis  Screening examination for STD (sexually transmitted disease)   STD screening was performed, patient advised that the results be posted to their MyChart and if any of the results are positive, they will be notified  by phone, further treatment will be provided as indicated based on results of STD screening. Patient advised can continue boric acid suppositories until results are received. Patient was advised to abstain from sexual intercourse until that they receive the results of their STD testing.  Patient was also advised to use condoms to protect themselves  from STD exposure. Urinalysis today was not concerning for UTI, pt currently menstruating. Urine pregnancy test was negative. Return precautions advised.  Drug allergies reviewed, all questions addressed.   Please see discharge instructions below for details of plan of care as provided to patient. ED Prescriptions   None    PDMP not reviewed this encounter.  Disposition Upon Discharge:  Condition: stable for discharge home  Patient presented with concern for an acute illness with associated systemic symptoms and significant discomfort requiring urgent management. In my opinion, this is a condition that a prudent lay person (someone who possesses an average knowledge of health and medicine) may potentially expect to result in complications if not addressed urgently such as respiratory distress, impairment of bodily function or dysfunction of bodily organs.   As such, the patient has been evaluated and assessed, work-up was performed and treatment was provided in alignment with urgent care protocols and evidence based medicine.  Patient/parent/caregiver has been advised that the patient may require follow up for further testing and/or treatment if the symptoms continue in spite of treatment, as clinically indicated and appropriate.  Routine symptom specific, illness specific and/or disease specific instructions were discussed with the patient and/or caregiver at length.  Prevention strategies for avoiding STD exposure were also discussed.  The patient will follow up with their current PCP if and as advised. If the patient does not currently have a PCP  we will assist them in obtaining one.   The patient may need specialty follow up if the symptoms continue, in spite of conservative treatment and management, for further workup, evaluation, consultation and treatment as clinically indicated and appropriate.  Patient/parent/caregiver verbalized understanding and agreement of plan as discussed.  All questions were addressed during visit.  Please see discharge instructions below for further details of plan.  Discharge Instructions:   Discharge Instructions      The results of your vaginal swab test which screens for BV, yeast, gonorrhea, chlamydia and trichomonas will be made posted to your MyChart account once it is complete.  This typically takes 2 to 4 days.  Please abstain from sexual intercourse of any kind, vaginal, oral or anal, until you have received the results of your STD testing.    You are welcome to continue using boric acid while you wait for your results.  I believe that this will be helpful.   If any of your results are abnormal, you will receive a phone call regarding treatment.  Prescriptions, if any are needed, will be provided for you at your pharmacy.     Your urine pregnancy test today is negative.   Our point-of-care analysis of your urine sample today was normal and did not reveal any concern for urinary tract infection.   If you have not had complete resolution of your symptoms after completing any recommended treatment or if your symptoms worsen, please return for repeat evaluation.   Thank you for visiting urgent care today.  I appreciate the opportunity to participate in your care.       This office note has been dictated using Teaching laboratory technician.  Unfortunately, this method of dictation can sometimes lead to typographical or grammatical errors.  I apologize for your inconvenience in advance if this occurs.  Please do not hesitate to reach out to me if clarification is needed.       Theadora Rama Scales, [place] 11/26/22 1219

## 2022-11-26 NOTE — Discharge Instructions (Addendum)
The results of your vaginal swab test which screens for BV, yeast, gonorrhea, chlamydia and trichomonas will be made posted to your MyChart account once it is complete.  This typically takes 2 to 4 days.  Please abstain from sexual intercourse of any kind, vaginal, oral or anal, until you have received the results of your STD testing.    You are welcome to continue using boric acid while you wait for your results.  I believe that this will be helpful.   If any of your results are abnormal, you will receive a phone call regarding treatment.  Prescriptions, if any are needed, will be provided for you at your pharmacy.     Your urine pregnancy test today is negative.   Our point-of-care analysis of your urine sample today was normal and did not reveal any concern for urinary tract infection.   If you have not had complete resolution of your symptoms after completing any recommended treatment or if your symptoms worsen, please return for repeat evaluation.   Thank you for visiting urgent care today.  I appreciate the opportunity to participate in your care.

## 2022-11-26 NOTE — ED Triage Notes (Signed)
Patient presents to UC for vaginal odor x 1 week. Treating with boric acid. Hx of BV. Req STD testing.

## 2022-11-29 DIAGNOSIS — Z419 Encounter for procedure for purposes other than remedying health state, unspecified: Secondary | ICD-10-CM | POA: Diagnosis not present

## 2022-11-29 LAB — CERVICOVAGINAL ANCILLARY ONLY
Bacterial Vaginitis (gardnerella): POSITIVE — AB
Candida Glabrata: NEGATIVE
Candida Vaginitis: NEGATIVE
Chlamydia: NEGATIVE
Comment: NEGATIVE
Comment: NEGATIVE
Comment: NEGATIVE
Comment: NEGATIVE
Comment: NEGATIVE
Comment: NORMAL
Neisseria Gonorrhea: NEGATIVE
Trichomonas: POSITIVE — AB

## 2022-11-30 ENCOUNTER — Telehealth (HOSPITAL_COMMUNITY): Payer: Self-pay | Admitting: Emergency Medicine

## 2022-11-30 MED ORDER — METRONIDAZOLE 500 MG PO TABS: 500.0000 mg | ORAL_TABLET | Freq: Two times a day (BID) | ORAL | 0 refills | Status: DC

## 2022-12-30 DIAGNOSIS — Z419 Encounter for procedure for purposes other than remedying health state, unspecified: Secondary | ICD-10-CM | POA: Diagnosis not present

## 2023-01-05 ENCOUNTER — Ambulatory Visit: Payer: Self-pay

## 2023-01-10 ENCOUNTER — Ambulatory Visit
Admission: RE | Admit: 2023-01-10 | Discharge: 2023-01-10 | Disposition: A | Discharge status: Home / Self Care | Payer: Medicaid Other | Source: Ambulatory Visit | Attending: Physician Assistant | Admitting: Physician Assistant

## 2023-01-10 VITALS — BP 98/62 | HR 94 | Temp 99.3°F | Resp 18 | Ht 64.0 in | Wt 142.0 lb

## 2023-01-10 DIAGNOSIS — N898 Other specified noninflammatory disorders of vagina: Secondary | ICD-10-CM | POA: Insufficient documentation

## 2023-01-10 DIAGNOSIS — L0291 Cutaneous abscess, unspecified: Secondary | ICD-10-CM | POA: Diagnosis not present

## 2023-01-10 LAB — POCT URINE PREGNANCY: Preg Test, Ur: NEGATIVE

## 2023-01-10 MED ORDER — DOXYCYCLINE HYCLATE 100 MG PO CAPS: 100.0000 mg | ORAL_CAPSULE | Freq: Two times a day (BID) | ORAL | 0 refills | Status: DC

## 2023-01-10 NOTE — ED Triage Notes (Signed)
"  I have 2 bumps/boils on inside of my upper left leg" as well as "some vaginal itching". No vaginal discharge "but having a lot of dryness". No concern for STI "but I want to check for one".

## 2023-01-10 NOTE — ED Provider Notes (Signed)
[name]    CSN: [id number] Arrival date & time: 01/10/23  1652      History   Chief Complaint Chief Complaint  Patient presents with   Vaginal Itching    Entered by patient    HPI Rachael Cook is a 22 y.o. female.   Patient here today for valuation of 2 boils inside her left upper leg/groin area.  She states there is some pain associated with this.  She has not had fever.  She also notes some vaginal itching but no vaginal discharge.  She would like STD screening but denies any exposure concerns for same.  She is unsure of pregnancy status.  The history is provided by the patient.  Vaginal Itching Pertinent negatives include no abdominal pain.    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 08/31/2022   Closed displaced fracture of medial malleolus of right tibia    Gonorrhea in female 02/01/2019   IUD (intrauterine device) in place 09/25/2018   Chlamydia infection 04/09/2018    Past Surgical History:  Procedure Laterality Date   ORIF ANKLE FRACTURE Right 01/30/2020   Procedure: OPEN REDUCTION INTERNAL FIXATION (ORIF) RIGHT ANKLE FRACTURE;  Surgeon: Nadara Mustard, MD;  Location: [facility] OR;  Service: Orthopedics;  Laterality: Right;   WISDOM TOOTH EXTRACTION      OB History     Gravida  2   Para  0   Term  0   Preterm  0   AB  1   Living  0      SAB  1   IAB  0   Ectopic  0   Multiple  0   Live Births  0            Home Medications    Prior to Admission medications   Medication Sig Start Date End Date Taking? Authorizing Provider  doxycycline (VIBRAMYCIN) 100 MG capsule Take 1 capsule (100 mg total) by mouth 2 (two) times daily. 01/10/23  Yes Tomi Bamberger, PA-C  metroNIDAZOLE (FLAGYL) 500 MG tablet Take 1 tablet (500 mg total) by mouth 2 (two)  times daily. 11/30/22   Lamptey, Britta Mccreedy, MD    Family History Family History  Problem Relation Age of Onset   Cancer - Colon Maternal Grandmother     Social History Social History   Tobacco Use   Smoking status: Never    Passive exposure: Never   Smokeless tobacco: Never  Vaping Use   Vaping status: Never Used  Substance Use Topics   Alcohol use: No   Drug use: No     Allergies   Patient has no known allergies.   Review of Systems Review of Systems  Constitutional:  Negative for chills and fever.  Eyes:  Negative for discharge and redness.  Gastrointestinal:  Negative for abdominal pain, nausea and vomiting.  Genitourinary:  Negative for vaginal discharge.  Skin:  Negative for color change.     Physical Exam Triage Vital Signs ED Triage Vitals  Encounter Vitals Group     BP 01/10/23 1700 98/62     Systolic BP Percentile --      Diastolic BP Percentile --      Pulse Rate 01/10/23 1700 94     Resp 01/10/23 1700 18     Temp  01/10/23 1700 99.3 F (37.4 C)     Temp Source 01/10/23 1700 Oral     SpO2 01/10/23 1700 98 %     Weight 01/10/23 1658 142 lb (64.4 kg)     Height 01/10/23 1658 5\' 4"  (1.626 m)     Head Circumference --      Peak Flow --      Pain Score 01/10/23 1658 6     Pain Loc --      Pain Education --      Exclude from Growth Chart --    No data found.  Updated Vital Signs BP 98/62 (BP Location: Right Arm)   Pulse 94   Temp 99.3 F (37.4 C) (Oral)   Resp 18   Ht 5\' 4"  (1.626 m)   Wt 142 lb (64.4 kg)   LMP 12/18/2022 (Exact Date)   SpO2 98%   BMI 24.37 kg/m      Physical Exam Vitals and nursing note reviewed.  Constitutional:      General: She is not in acute distress.    Appearance: Normal appearance. She is not ill-appearing.  HENT:     Head: Normocephalic and atraumatic.  Eyes:     Conjunctiva/sclera: Conjunctivae normal.  Cardiovascular:     Rate and Rhythm: Normal rate.  Pulmonary:     Effort: Pulmonary effort is  normal. No respiratory distress.  Genitourinary:    Comments: 2 cm area of induration without drainage to left groin area Neurological:     Mental Status: She is alert.  Psychiatric:        Mood and Affect: Mood normal.        Behavior: Behavior normal.        Thought Content: Thought content normal.      UC Treatments / Results  Labs (all labs ordered are listed, but only abnormal results are displayed) Labs Reviewed  POCT URINE PREGNANCY  CYTOLOGY, (ORAL, ANAL, URETHRAL) ANCILLARY ONLY    EKG   Radiology No results found.  Procedures Procedures (including critical care time)  Medications Ordered in UC Medications - No data to display  Initial Impression / Assessment and Plan / UC Course  I have reviewed the triage vital signs and the nursing notes.  Pertinent labs & imaging results that were available during my care of the patient were reviewed by me and considered in my medical decision making (see chart for details).    Doxycycline prescribed to cover abscess.  STD screening ordered.  Will await results further recommendation.  Advised pregnancy test negative in office.  Final Clinical Impressions(s) / UC Diagnoses   Final diagnoses:  Abscess  Vaginal itching   Discharge Instructions   None    ED Prescriptions     Medication Sig Dispense Auth. Provider   doxycycline (VIBRAMYCIN) 100 MG capsule Take 1 capsule (100 mg total) by mouth 2 (two) times daily. 20 capsule Tomi Bamberger, PA-C      PDMP not reviewed this encounter.   Tomi Bamberger, PA-C 01/10/23 2023

## 2023-01-30 DIAGNOSIS — Z419 Encounter for procedure for purposes other than remedying health state, unspecified: Secondary | ICD-10-CM | POA: Diagnosis not present

## 2023-03-01 DIAGNOSIS — Z419 Encounter for procedure for purposes other than remedying health state, unspecified: Secondary | ICD-10-CM | POA: Diagnosis not present

## 2023-04-01 DIAGNOSIS — Z419 Encounter for procedure for purposes other than remedying health state, unspecified: Secondary | ICD-10-CM | POA: Diagnosis not present

## 2023-04-09 ENCOUNTER — Ambulatory Visit: Payer: Self-pay

## 2023-05-01 DIAGNOSIS — Z419 Encounter for procedure for purposes other than remedying health state, unspecified: Secondary | ICD-10-CM | POA: Diagnosis not present

## 2023-05-03 ENCOUNTER — Other Ambulatory Visit: Payer: Self-pay

## 2023-05-03 ENCOUNTER — Encounter (HOSPITAL_BASED_OUTPATIENT_CLINIC_OR_DEPARTMENT_OTHER): Payer: Self-pay

## 2023-05-03 ENCOUNTER — Emergency Department (HOSPITAL_BASED_OUTPATIENT_CLINIC_OR_DEPARTMENT_OTHER)
Admission: EM | Admit: 2023-05-03 | Discharge: 2023-05-03 | Disposition: A | Discharge status: Home / Self Care | Payer: Medicaid Other | Attending: Emergency Medicine | Admitting: Emergency Medicine

## 2023-05-03 DIAGNOSIS — S0502XA Injury of conjunctiva and corneal abrasion without foreign body, left eye, initial encounter: Secondary | ICD-10-CM | POA: Diagnosis not present

## 2023-05-03 DIAGNOSIS — S0592XA Unspecified injury of left eye and orbit, initial encounter: Secondary | ICD-10-CM | POA: Diagnosis present

## 2023-05-03 DIAGNOSIS — X58XXXA Exposure to other specified factors, initial encounter: Secondary | ICD-10-CM | POA: Diagnosis not present

## 2023-05-03 MED ORDER — HYDROCODONE-ACETAMINOPHEN 5-325 MG PO TABS: 1.0000 | ORAL_TABLET | Freq: Four times a day (QID) | ORAL | 0 refills | Status: AC | PRN

## 2023-05-03 MED ORDER — FLUORESCEIN SODIUM 1 MG OP STRP
1.0000 | ORAL_STRIP | Freq: Once | OPHTHALMIC | Status: AC
  Administered 2023-05-03: 1 via OPHTHALMIC
  Filled 2023-05-03: qty 1

## 2023-05-03 MED ORDER — CIPROFLOXACIN HCL 0.3 % OP OINT: TOPICAL_OINTMENT | OPHTHALMIC | 0 refills | Status: DC

## 2023-05-03 MED ORDER — TETRACAINE HCL 0.5 % OP SOLN
2.0000 [drp] | Freq: Once | OPHTHALMIC | Status: AC
  Administered 2023-05-03: 2 [drp] via OPHTHALMIC
  Filled 2023-05-03: qty 4

## 2023-05-03 NOTE — ED Provider Notes (Signed)
[place] EMERGENCY DEPARTMENT AT [facility] Provider Note   CSN: [id number] Arrival date & time: 05/03/23  4098     History  Chief Complaint  Patient presents with   Eye Pain    Rachael Cook is a 22 year old history with past medical corneal abrasion, sickle cell trait who presented this a.m. with 1 day of worsening left eye pain.  The patient states since about midnight last night the pain is gotten more severe and she has been unable to open her eye.  She endorses some nausea, photophobia and blurry vision as well.  Patient denies fevers, chills, or discharge.  No other associated symptoms.    Eye Pain Pertinent negatives include no chest pain, no abdominal pain and no shortness of breath.       Home Medications Prior to Admission medications   Medication Sig Start Date End Date Taking? Authorizing Provider  ciprofloxacin (CILOXAN) 0.3 % ophthalmic ointment Place 1 drop into the left eye every 2 (two) hours. Administer 1 drop, every 2 hours, while awake, for 2 days. Then 1 drop, every 4 hours, while awake, for the next 5 days. 05/03/23  Yes Lovie Macadamia, MD  HYDROcodone-acetaminophen (NORCO) 5-325 MG tablet Take 1 tablet by mouth every 6 (six) hours as needed for up to 5 days for moderate pain (pain score 4-6). 05/03/23 05/08/23 Yes Lovie Macadamia, MD  doxycycline (VIBRAMYCIN) 100 MG capsule Take 1 capsule (100 mg total) by mouth 2 (two) times daily. 01/10/23   Tomi Bamberger, PA-C  metroNIDAZOLE (FLAGYL) 500 MG tablet Take 1 tablet (500 mg total) by mouth 2 (two) times daily. 11/30/22   Lamptey, Britta Mccreedy, MD      Allergies    Patient has no known allergies.    Review of Systems   Review of Systems  Eyes:  Positive for photophobia and pain.       Mild swelling of the left eyelid  Respiratory:  Negative for shortness of breath.   Cardiovascular:  Negative for chest pain and leg swelling.  Gastrointestinal:  Negative for abdominal pain.     Physical Exam Updated Vital Signs BP 112/77   Pulse (!) 109   Temp 98.6 F (37 C) (Oral)   Resp 18   Ht 5\' 4"  (1.626 m)   Wt 59 kg   LMP 07/08/2022   SpO2 98%   BMI 22.31 kg/m  Physical Exam Constitutional:      Appearance: Normal appearance.  Eyes:     Comments: Woods lamp examination with fluorescence demonstrated large corneal abrasion of the inferior portion of the eye.  Cardiovascular:     Rate and Rhythm: Normal rate and regular rhythm.     Pulses: Normal pulses.     Heart sounds: Normal heart sounds.  Pulmonary:     Effort: Pulmonary effort is normal.     Breath sounds: Normal breath sounds.  Skin:    General: Skin is warm and dry.  Neurological:     Mental Status: She is alert.  Psychiatric:        Mood and Affect: Mood normal.        Behavior: Behavior normal.     ED Results / Procedures / Treatments   Labs (all labs ordered are listed, but only abnormal results are displayed) Labs Reviewed - No data to display  EKG None  Radiology No results found.  Procedures Procedures    Medications Ordered in ED Medications  fluorescein ophthalmic strip 1 strip (1 strip Left  Eye Given by Other 05/03/23 0729)  tetracaine (PONTOCAINE) 0.5 % ophthalmic solution 2 drop (2 drops Left Eye Given by Other 05/03/23 1610)    ED Course/ Medical Decision Making/ A&P                                 Medical Decision Making Is a 22 year old female with a relatively unremarkable past medical history presenting with left eye pain.  Examination and history most compatible with corneal abrasion with the left eye.  No significant redness or discharge to suggest conjunctivitis.  She has had corneal abrasion of this eye in the past, but does not know what may have caused this episode.  She does not wear contact lenses.  In significant pain today, with significant amount of photophobia.  Will discharge with ciprofloxacin eyedrops as well as short course of Norco 5.  Also  recommend the patient try Tylenol and ibuprofen prior to using opioid medications.  Recommended patient to follow with ophthalmology for further workup and management.  Risk Prescription drug management.           Final Clinical Impression(s) / ED Diagnoses Final diagnoses:  Abrasion of left cornea, initial encounter    Rx / DC Orders ED Discharge Orders          Ordered    ciprofloxacin (CILOXAN) 0.3 % ophthalmic ointment        05/03/23 0805    HYDROcodone-acetaminophen (NORCO) 5-325 MG tablet  Every 6 hours PRN        05/03/23 0805              Lovie Macadamia, MD 05/03/23 1534    Jacalyn Lefevre, MD 05/06/23 [phone]

## 2023-05-03 NOTE — ED Triage Notes (Signed)
Pt reports left eye started feeling dry and itchy yesterday morning and started getting progressively painful as the day went on. Pt also reports increased swelling as well. Pt unable to open eye d/t pain at this time.

## 2023-05-03 NOTE — Discharge Instructions (Addendum)
Please use antibiotic eye drops as directed and pain medication as needed while your eye heals. Please also call Dr. Claudie Leach office to schedule an appointment to see him for your corneal abrasion.   Rachael Cook, M.D. [facility]: [phone]

## 2023-05-16 ENCOUNTER — Ambulatory Visit
Admission: EM | Admit: 2023-05-16 | Discharge: 2023-05-16 | Disposition: A | Discharge status: Home / Self Care | Payer: Medicaid Other | Attending: Physician Assistant | Admitting: Physician Assistant

## 2023-05-16 DIAGNOSIS — L02214 Cutaneous abscess of groin: Secondary | ICD-10-CM | POA: Diagnosis present

## 2023-05-16 DIAGNOSIS — Z113 Encounter for screening for infections with a predominantly sexual mode of transmission: Secondary | ICD-10-CM | POA: Diagnosis not present

## 2023-05-16 DIAGNOSIS — L0291 Cutaneous abscess, unspecified: Secondary | ICD-10-CM

## 2023-05-16 MED ORDER — DOXYCYCLINE HYCLATE 100 MG PO CAPS: 100.0000 mg | ORAL_CAPSULE | Freq: Two times a day (BID) | ORAL | 0 refills | Status: AC

## 2023-05-16 NOTE — ED Triage Notes (Signed)
"  I am feeling funny down their and when having intercourse noticed some bleeding after (quickly resolved, no active bleeding)". "I am noticing also a boil (not open/draining) in groin/vaginal area". No fever. No trauma.

## 2023-05-17 LAB — CERVICOVAGINAL ANCILLARY ONLY
Chlamydia: POSITIVE — AB
Comment: NEGATIVE
Comment: NEGATIVE
Comment: NORMAL
Neisseria Gonorrhea: NEGATIVE
Trichomonas: NEGATIVE

## 2023-05-18 ENCOUNTER — Telehealth (HOSPITAL_BASED_OUTPATIENT_CLINIC_OR_DEPARTMENT_OTHER): Payer: Self-pay | Admitting: Emergency Medicine

## 2023-05-18 NOTE — Telephone Encounter (Signed)
Called and spoke with patient after using two patient identifiers. Made patient aware of positive result from her cytology swab. Confirmed which pharmacy patient preferred to send in Doxycycline too. Pt verbalized understanding of medication instructions.

## 2023-05-26 NOTE — ED Provider Notes (Signed)
[name]    CSN: [id number] Arrival date & time: 05/16/23  1455      History   Chief Complaint Chief Complaint  Patient presents with   SEXUALLY TRANSMITTED DISEASE    Testing   Vaginal Problems    HPI Rachael Cook is a 22 y.o. female.   Patient here today for evaluation of a "funny feeling" in her pelvic area when having intercourse and noticed some bleeding right after.  She would like STD screening.  She denies any known exposures.  She also reports a small boil in the groin area that is not currently draining.  She has not any fever.  She denies any trauma.  The history is provided by the patient.    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   Closed displaced fracture of medial malleolus of right tibia     Past Surgical History:  Procedure Laterality Date   ORIF ANKLE FRACTURE Right 01/30/2020   Procedure: OPEN REDUCTION INTERNAL FIXATION (ORIF) RIGHT ANKLE FRACTURE;  Surgeon: Nadara Mustard, MD;  Location: [facility] OR;  Service: Orthopedics;  Laterality: Right;   WISDOM TOOTH EXTRACTION      OB History     Gravida  2   Para  0   Term  0   Preterm  0   AB  1   Living  0      SAB  1   IAB  0   Ectopic  0   Multiple  0   Live Births  0            Home Medications    Prior to Admission medications   Medication Sig Start Date End Date Taking? Authorizing Provider  ciprofloxacin (CILOXAN) 0.3 % ophthalmic solution Place 1 drop into both eyes every 4 (four) hours while awake. 05/03/23  Yes [provider]  ciprofloxacin (CILOXAN) 0.3 % ophthalmic ointment Place 1 drop  into the left eye every 2 (two) hours. Administer 1 drop, every 2 hours, while awake, for 2 days. Then 1 drop, every 4 hours, while awake, for the next 5 days. 05/03/23   Lovie Macadamia, MD  metroNIDAZOLE (FLAGYL) 500 MG tablet Take 1 tablet (500 mg total) by mouth 2 (two) times daily. 11/30/22   Lamptey, Britta Mccreedy, MD    Family History Family History  Problem Relation Age of Onset   Cancer - Colon Maternal Grandmother     Social History Social History   Tobacco Use   Smoking status: Never    Passive exposure: Never   Smokeless tobacco: Never  Vaping Use   Vaping status: Never Used  Substance Use Topics   Alcohol use: No   Drug use: Never     Allergies   Patient has no known allergies.   Review of Systems Review of Systems  Constitutional:  Negative for chills and fever.  Eyes:  Negative for discharge and redness.  Respiratory:  Negative for shortness of breath.   Gastrointestinal:  Negative for abdominal pain, nausea and vomiting.  Genitourinary:  Positive for vaginal bleeding. Negative for vaginal discharge.  Skin:  Positive for color change. Negative for wound.     Physical Exam Triage Vital Signs ED Triage Vitals  Encounter Vitals Group     BP 05/16/23 1644 (!) 98/58     Systolic BP Percentile --      Diastolic BP Percentile --      Pulse Rate 05/16/23 1644 86     Resp 05/16/23 1644 16     Temp 05/16/23 1644 98.1 F (36.7 C)     Temp Source 05/16/23 1644 Oral     SpO2 05/16/23 1644 98 %     Weight 05/16/23 1642 135 lb (61.2 kg)     Height 05/16/23 1642 5\' 1"  (1.549 m)     Head Circumference --      Peak Flow --      Pain Score 05/16/23 1638 6     Pain Loc --      Pain Education --      Exclude from Growth Chart --    No data found.  Updated Vital Signs BP (!) 98/58 (BP Location: Right Arm) Comment: Discussed with patient. To recheck if needed.  Pulse 86   Temp 98.1 F (36.7 C) (Oral)   Resp 16   Ht 5\' 1"  (1.549 m)   Wt 135 lb (61.2 kg)   LMP  05/08/2023 (Exact Date)   SpO2 98%   BMI 25.51 kg/m   Visual Acuity Right Eye Distance:   Left Eye Distance:   Bilateral Distance:    Right Eye Near:   Left Eye Near:    Bilateral Near:     Physical Exam Vitals and nursing note reviewed.  Constitutional:      General: She is not in acute distress.    Appearance: Normal appearance. She is not ill-appearing.  HENT:     Head: Normocephalic and atraumatic.  Eyes:     Conjunctiva/sclera: Conjunctivae normal.  Cardiovascular:     Rate and Rhythm: Normal rate.  Pulmonary:     Effort: Pulmonary effort is normal. No respiratory distress.  Skin:    Comments: Small area of induration with no fluctuance noted to groin area  Neurological:     Mental Status: She is alert.  Psychiatric:        Mood and Affect: Mood normal.        Behavior: Behavior normal.        Thought Content: Thought content normal.      UC Treatments / Results  Labs (all labs ordered are listed, but only abnormal results are displayed) Labs Reviewed  CERVICOVAGINAL ANCILLARY ONLY - Abnormal; Notable for the following components:      Result Value   Chlamydia Positive (*)    All other components within normal limits    EKG   Radiology No results found.  Procedures Procedures (including critical care time)  Medications Ordered in UC Medications - No data to display  Initial Impression / Assessment and Plan / UC Course  I have reviewed the triage vital signs and the nursing notes.  Pertinent labs & imaging results that were available during my care of the patient were reviewed by me and considered in my medical decision making (see chart for details).    Will screen for STDs and will also treat to cover small abscess with doxycycline.  Advised warm compresses to promote drainage and follow-up if no gradual improvement or with any further concerns.  Final Clinical Impressions(s) / UC Diagnoses   Final diagnoses:  Screening for STD (sexually  transmitted disease)  Abscess   Discharge Instructions   None    ED Prescriptions     Medication Sig Dispense Auth. Provider   doxycycline (VIBRAMYCIN) 100 MG capsule Take 1 capsule (100 mg total) by mouth 2 (two) times daily for 7 days. 14 capsule Tomi Bamberger, PA-C      PDMP not reviewed this encounter.   Tomi Bamberger, PA-C 05/26/23 [phone]

## 2023-06-01 DIAGNOSIS — Z419 Encounter for procedure for purposes other than remedying health state, unspecified: Secondary | ICD-10-CM | POA: Diagnosis not present

## 2023-07-02 DIAGNOSIS — Z419 Encounter for procedure for purposes other than remedying health state, unspecified: Secondary | ICD-10-CM | POA: Diagnosis not present

## 2023-07-06 ENCOUNTER — Ambulatory Visit (HOSPITAL_COMMUNITY)
Admission: EM | Admit: 2023-07-06 | Discharge: 2023-07-06 | Disposition: A | Discharge status: Home / Self Care | Payer: Medicaid Other | Attending: Family Medicine | Admitting: Family Medicine

## 2023-07-06 ENCOUNTER — Encounter (HOSPITAL_COMMUNITY): Payer: Self-pay

## 2023-07-06 DIAGNOSIS — R1909 Other intra-abdominal and pelvic swelling, mass and lump: Secondary | ICD-10-CM | POA: Diagnosis not present

## 2023-07-06 MED ORDER — SULFAMETHOXAZOLE-TRIMETHOPRIM 800-160 MG PO TABS: 1.0000 | ORAL_TABLET | Freq: Two times a day (BID) | ORAL | 0 refills | Status: AC

## 2023-07-06 NOTE — ED Triage Notes (Signed)
 Pt c/o abscess to lt upper thigh for over a month. States tx'd at [facility] a few weeks ago with doxycycline  and still there. Denies drainage.

## 2023-07-06 NOTE — ED Provider Notes (Signed)
 [facility]   [id number] 07/06/23 Arrival Time: 1748  ASSESSMENT & PLAN:  1. Groin mass in female    No indication for I&D this evening. Ques early abscess vs cyst. May need GYN f/u if Bactrim  doesn't help.  Meds ordered this encounter  Medications   sulfamethoxazole -trimethoprim  (BACTRIM  DS) 800-160 MG tablet    Sig: Take 1 tablet by mouth 2 (two) times daily for 10 days.    Dispense:  20 tablet    Refill:  0     Follow-up Information     Schedule an appointment as soon as possible for a visit  with Theotis Haze ORN, NP.   Specialty: Nurse Practitioner Why: For follow up. Contact information: [address] [place] [address] [place] [address] [phone]         Schedule an appointment as soon as possible for a visit  with [facility].   Specialty: Obstetrics and Gynecology Contact information: [address] [place]  [address] [phone]                Reviewed expectations re: course of current medical issues. Questions answered. Outlined signs and symptoms indicating need for more acute intervention. Understanding verbalized. After Visit Summary given.   SUBJECTIVE: History from: Patient. Rachael Cook is a 23 y.o. female. Pt c/o abscess to lt upper thigh for over a month. States tx'd at [facility] a few weeks ago with doxycycline  and still there. Denies drainage.  Denies: fever. Normal PO intake without n/v/d.  OBJECTIVE:  Vitals:   07/06/23 1904  BP: 97/60  Pulse: 74  Resp: 18  Temp: 99.2 F (37.3 C)  TempSrc: Oral  SpO2: 98%    General appearance: alert; no distress GU: nurse chaperone present Harlan); sub cm soft tissue thickening just lateral to L lower labia; without overlying erythema/warmth Skin: warm and dry Neurologic: normal gait Psychological: alert and cooperative; normal mood and affect   No Known Allergies  Past Medical History:   Diagnosis Date   Chlamydia infection 04/09/2018   Gonorrhea in female 02/01/2019   Treated on 02-02-19     IUD (intrauterine device) in place 09/25/2018   Kyleena  placed 04/2018     Sickle cell trait (HCC)    Social History   Socioeconomic History   Marital status: Single    Spouse name: Not on file   Number of children: Not on file   Years of education: Not on file   Highest education level: Not on file  Occupational History   Not on file  Tobacco Use   Smoking status: Never    Passive exposure: Never   Smokeless tobacco: Never  Vaping Use   Vaping status: Never Used  Substance and Sexual Activity   Alcohol use: No   Drug use: Never   Sexual activity: Yes    Partners: Male    Birth control/protection: None    Comment: Last encounter: [phone]  Other Topics Concern   Not on file  Social History Narrative   Not on file   Social Drivers of Health   Financial Resource Strain: Not on file  Food Insecurity: Not on file  Transportation Needs: Not on file  Physical Activity: Not on file  Stress: Not on file  Social Connections: Not on file  Intimate Partner Violence: Not on file   Family History  Problem Relation Age of Onset   Cancer - Colon Maternal Grandmother    Past Surgical  History:  Procedure Laterality Date   ORIF ANKLE FRACTURE Right 01/30/2020   Procedure: OPEN REDUCTION INTERNAL FIXATION (ORIF) RIGHT ANKLE FRACTURE;  Surgeon: Harden Jerona GAILS, MD;  Location: [facility];  Service: Orthopedics;  Laterality: Right;   WISDOM TOOTH EXTRACTION       Rolinda Rogue, MD 07/06/23 [phone]

## 2023-07-30 DIAGNOSIS — Z419 Encounter for procedure for purposes other than remedying health state, unspecified: Secondary | ICD-10-CM | POA: Diagnosis not present

## 2023-08-20 ENCOUNTER — Ambulatory Visit
Admission: EM | Admit: 2023-08-20 | Discharge: 2023-08-20 | Disposition: A | Discharge status: Home / Self Care | Attending: Internal Medicine | Admitting: Internal Medicine

## 2023-08-20 DIAGNOSIS — L02214 Cutaneous abscess of groin: Secondary | ICD-10-CM | POA: Insufficient documentation

## 2023-08-20 DIAGNOSIS — N949 Unspecified condition associated with female genital organs and menstrual cycle: Secondary | ICD-10-CM | POA: Insufficient documentation

## 2023-08-20 DIAGNOSIS — Z113 Encounter for screening for infections with a predominantly sexual mode of transmission: Secondary | ICD-10-CM | POA: Diagnosis not present

## 2023-08-20 LAB — POCT URINE PREGNANCY: Preg Test, Ur: NEGATIVE

## 2023-08-20 MED ORDER — DOXYCYCLINE HYCLATE 100 MG PO CAPS: 100.0000 mg | ORAL_CAPSULE | Freq: Two times a day (BID) | ORAL | 0 refills | Status: DC

## 2023-08-20 NOTE — Discharge Instructions (Signed)
 STD testing is pending.  Will call if anything is positive.  I have prescribed an antibiotic for concern for abscess.  Use warm compresses as we discussed.  Follow-up with appropriate specialist.

## 2023-08-20 NOTE — ED Provider Notes (Addendum)
 [name]    CSN: [id number] Arrival date & time: 08/20/23  1106      History   Chief Complaint Chief Complaint  Patient presents with   SEXUALLY TRANSMITTED DISEASE   Recurrent Skin Infections    HPI Rachael Cook is a 23 y.o. female.   Patient presents for STD testing today.  Reports that she has had some "vaginal burning".  Patient is not reporting dysuria or urinary frequency.  Denies any new vaginal discharge.  Denies any exposure to STD but has been having unprotected sexual intercourse.  She does not use any form of birth control.  Reports last menstrual cycle was sometime in February but she is not sure of exact date.  Patient also reporting a recurrent boil in the left groin area.  She was last seen for this in early February where she was prescribed Bactrim with minimal improvement.  She was advised to follow-up with gynecologist but reports that she has not been able to get in with appointment.  Denies any drainage from the area or any associated fever.     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   Closed displaced fracture of medial malleolus of right tibia     Past Surgical History:  Procedure Laterality Date   ORIF ANKLE FRACTURE Right 01/30/2020   Procedure: OPEN REDUCTION INTERNAL FIXATION (ORIF) RIGHT ANKLE FRACTURE;  Surgeon: Nadara Mustard, MD;  Location: [facility] OR;  Service: Orthopedics;  Laterality: Right;   WISDOM TOOTH EXTRACTION      OB History     Gravida  2   Para  0   Term  0   Preterm  0   AB  1   Living  0      SAB  1   IAB  0   Ectopic  0   Multiple  0   Live  Births  0            Home Medications    Prior to Admission medications   Medication Sig Start Date End Date Taking? Authorizing Provider  doxycycline (VIBRAMYCIN) 100 MG capsule Take 1 capsule (100 mg total) by mouth 2 (two) times daily. 08/20/23  Yes Helene Bernstein, Acie Fredrickson, FNP    Family History Family History  Problem Relation Age of Onset   Cancer - Colon Maternal Grandmother     Social History Social History   Tobacco Use   Smoking status: Never    Passive exposure: Never   Smokeless tobacco: Never  Vaping Use   Vaping status: Never Used  Substance Use Topics   Alcohol use: No   Drug use: Never     Allergies   Patient has no known allergies.   Review of Systems Review of Systems Per HPI  Physical Exam Triage Vital Signs ED Triage Vitals  Encounter Vitals Group     BP 08/20/23 1131 108/66     Systolic BP Percentile --      Diastolic BP Percentile --      Pulse Rate 08/20/23 1131 88     Resp 08/20/23 1131 16  Temp 08/20/23 1131 98.2 F (36.8 C)     Temp Source 08/20/23 1131 Oral     SpO2 08/20/23 1131 98 %     Weight --      Height --      Head Circumference --      Peak Flow --      Pain Score 08/20/23 1128 8     Pain Loc --      Pain Education --      Exclude from Growth Chart --    No data found.  Updated Vital Signs BP 108/66 (BP Location: Left Arm)   Pulse 88   Temp 98.2 F (36.8 C) (Oral)   Resp 16   LMP 07/11/2023   SpO2 98%   Visual Acuity Right Eye Distance:   Left Eye Distance:   Bilateral Distance:    Right Eye Near:   Left Eye Near:    Bilateral Near:     Physical Exam Exam conducted with a chaperone present.  Constitutional:      General: She is not in acute distress.    Appearance: Normal appearance. She is not toxic-appearing or diaphoretic.  HENT:     Head: Normocephalic and atraumatic.  Eyes:     Extraocular Movements: Extraocular movements intact.     Conjunctiva/sclera: Conjunctivae normal.  Pulmonary:      Effort: Pulmonary effort is normal.  Genitourinary:      Comments: Self vaginal swab performed prior to examination.  Patient has approximately 1-2 cm in diameter, flat, indurated flesh-colored area present to the left groin.  No drainage present. Neurological:     General: No focal deficit present.     Mental Status: She is alert and oriented to person, place, and time. Mental status is at baseline.  Psychiatric:        Mood and Affect: Mood normal.        Behavior: Behavior normal.        Thought Content: Thought content normal.        Judgment: Judgment normal.      UC Treatments / Results  Labs (all labs ordered are listed, but only abnormal results are displayed) Labs Reviewed  POCT URINE PREGNANCY - Normal  CERVICOVAGINAL ANCILLARY ONLY    EKG   Radiology No results found.  Procedures Procedures (including critical care time)  Medications Ordered in UC Medications - No data to display  Initial Impression / Assessment and Plan / UC Course  I have reviewed the triage vital signs and the nursing notes.  Pertinent labs & imaging results that were available during my care of the patient were reviewed by me and considered in my medical decision making (see chart for details).     Cervicovaginal swab pending.  Awaiting results.  Urine pregnancy test was negative but encouraged to follow-up with gynecology if she continues to miss menstrual cycle.  Patient declined blood work for HIV and syphilis.  Patient has Cyst/abscess like area present to left groin.  Advised warm compresses.  Will treat with doxycycline.  Advised patient to follow-up with gynecology and/or dermatology for further evaluation and management.  No I&D indicated at this time. Discussed adverse effects of multiple recent antibiotic use and patient voiced understanding of this. She wishes to proceed with antibiotic management. Advised strict follow-up precautions.  Patient verbalized understanding and was  agreeable with plan. Final Clinical Impressions(s) / UC Diagnoses   Final diagnoses:  Abscess of groin, left  Screening examination for venereal disease  Vaginal discomfort     Discharge Instructions      STD testing is pending.  Will call if anything is positive.  I have prescribed an antibiotic for concern for abscess.  Use warm compresses as we discussed.  Follow-up with appropriate specialist.    ED Prescriptions     Medication Sig Dispense Auth. Provider   doxycycline (VIBRAMYCIN) 100 MG capsule Take 1 capsule (100 mg total) by mouth 2 (two) times daily. 20 capsule Gustavus Bryant, [place]      PDMP not reviewed this encounter.   Gustavus Bryant, [place] 08/20/23 1228    Gustavus Bryant, [place] 08/20/23 1229

## 2023-08-20 NOTE — ED Triage Notes (Signed)
 Pt presents for STD testing. Pt states it is swollen and painful in vaginal area. Symptoms onset 2 days ago. Last sexual encounter 2 days ago as well.   The boil has been recurring in the same area for 2 to 3 months. Pt wants it drained.

## 2023-08-22 LAB — CERVICOVAGINAL ANCILLARY ONLY
Bacterial Vaginitis (gardnerella): NEGATIVE
Candida Glabrata: NEGATIVE
Candida Vaginitis: NEGATIVE
Chlamydia: NEGATIVE
Comment: NEGATIVE
Comment: NEGATIVE
Comment: NEGATIVE
Comment: NEGATIVE
Comment: NEGATIVE
Comment: NORMAL
Neisseria Gonorrhea: NEGATIVE
Trichomonas: NEGATIVE

## 2023-09-02 ENCOUNTER — Other Ambulatory Visit: Payer: Self-pay

## 2023-09-02 ENCOUNTER — Ambulatory Visit
Admission: RE | Admit: 2023-09-02 | Discharge: 2023-09-02 | Disposition: A | Discharge status: Home / Self Care | Source: Ambulatory Visit | Attending: Family Medicine | Admitting: Family Medicine

## 2023-09-02 VITALS — BP 99/68 | HR 91 | Temp 98.0°F | Resp 16

## 2023-09-02 DIAGNOSIS — Z113 Encounter for screening for infections with a predominantly sexual mode of transmission: Secondary | ICD-10-CM | POA: Diagnosis present

## 2023-09-02 DIAGNOSIS — L292 Pruritus vulvae: Secondary | ICD-10-CM | POA: Insufficient documentation

## 2023-09-02 DIAGNOSIS — N898 Other specified noninflammatory disorders of vagina: Secondary | ICD-10-CM | POA: Insufficient documentation

## 2023-09-02 LAB — POCT URINE PREGNANCY: Preg Test, Ur: NEGATIVE

## 2023-09-02 NOTE — ED Triage Notes (Signed)
 It has a funny smell and itchy - Entered by patient   Pt states the smell has not improved and she now has an itch. Discharge yellowish color. Pt states irritation when urinating.

## 2023-09-02 NOTE — ED Provider Notes (Signed)
 [name]    CSN: [id number] Arrival date & time: 09/02/23  1026      History   Chief Complaint Chief Complaint  Patient presents with   Vaginal Discharge    It has a funny smell and itchy - Entered by patient    HPI Rachael Cook is a 23 y.o. female.    Vaginal Discharge  Patient is here for vaginal discharge and odor x 2 days.  D/c is yellowish in color.  No known exposures. She declined blood work today.  No other issues.        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   Closed displaced fracture of medial malleolus of right tibia     Past Surgical History:  Procedure Laterality Date   ORIF ANKLE FRACTURE Right 01/30/2020   Procedure: OPEN REDUCTION INTERNAL FIXATION (ORIF) RIGHT ANKLE FRACTURE;  Surgeon: Nadara Mustard, MD;  Location: [facility] OR;  Service: Orthopedics;  Laterality: Right;   WISDOM TOOTH EXTRACTION      OB History     Gravida  2   Para  0   Term  0   Preterm  0   AB  1   Living  0      SAB  1   IAB  0   Ectopic  0   Multiple  0   Live Births  0            Home Medications    Prior to Admission medications   Medication Sig Start Date End Date Taking? Authorizing Provider  doxycycline (VIBRAMYCIN) 100 MG capsule Take 1 capsule (100 mg total) by mouth 2 (two) times daily. 08/20/23  Yes [place], Acie Fredrickson, FNP    Family History Family History  Problem Relation Age of Onset   Cancer - Colon Maternal Grandmother     Social History Social History   Tobacco Use   Smoking status: Never    Passive exposure: Never   Smokeless tobacco: Never  Vaping Use    Vaping status: Never Used  Substance Use Topics   Alcohol use: No   Drug use: Never     Allergies   Patient has no known allergies.   Review of Systems Review of Systems  Constitutional: Negative.   HENT: Negative.    Respiratory: Negative.    Cardiovascular: Negative.   Gastrointestinal: Negative.   Genitourinary:  Positive for vaginal discharge.  Psychiatric/Behavioral: Negative.       Physical Exam Triage Vital Signs ED Triage Vitals  Encounter Vitals Group     BP 09/02/23 1037 99/68     Systolic BP Percentile --      Diastolic BP Percentile --      Pulse Rate 09/02/23 1037 91     Resp 09/02/23 1037 16     Temp 09/02/23 1037 98 F (36.7 C)     Temp Source 09/02/23 1037 Oral     SpO2 09/02/23 1037 97 %     Weight --      Height --      Head Circumference --  Peak Flow --      Pain Score 09/02/23 1035 0     Pain Loc --      Pain Education --      Exclude from Growth Chart --    No data found.  Updated Vital Signs BP 99/68 (BP Location: Right Arm)   Pulse 91   Temp 98 F (36.7 C) (Oral)   Resp 16   LMP 08/27/2023 (Exact Date)   SpO2 97%   Visual Acuity Right Eye Distance:   Left Eye Distance:   Bilateral Distance:    Right Eye Near:   Left Eye Near:    Bilateral Near:     Physical Exam Constitutional:      Appearance: Normal appearance. She is normal weight.  Cardiovascular:     Rate and Rhythm: Normal rate and regular rhythm.  Pulmonary:     Effort: Pulmonary effort is normal.     Breath sounds: Normal breath sounds.  Neurological:     General: No focal deficit present.     Mental Status: She is alert.  Psychiatric:        Mood and Affect: Mood normal.      UC Treatments / Results  Labs (all labs ordered are listed, but only abnormal results are displayed) Labs Reviewed  POCT URINE PREGNANCY  CERVICOVAGINAL ANCILLARY ONLY   UPT negative  EKG   Radiology No results found.  Procedures Procedures (including critical  care time)  Medications Ordered in UC Medications - No data to display  Initial Impression / Assessment and Plan / UC Course  I have reviewed the triage vital signs and the nursing notes.  Pertinent labs & imaging results that were available during my care of the patient were reviewed by me and considered in my medical decision making (see chart for details).  Final Clinical Impressions(s) / UC Diagnoses   Final diagnoses:  Vaginal discharge     Discharge Instructions      You were seen today for vaginal discharge.  Your vaginal swab will be resulted tomorrow and you will see this result on mychart.  You should receive a phone call if there are any positive results.  If you see something before we do, then you may call here for any discussion.  Please avoid intercourse until results are complete.     ED Prescriptions   None    PDMP not reviewed this encounter.   Jannifer Franklin, MD 09/02/23 1058

## 2023-09-02 NOTE — Discharge Instructions (Signed)
 You were seen today for vaginal discharge.  Your vaginal swab will be resulted tomorrow and you will see this result on mychart.  You should receive a phone call if there are any positive results.  If you see something before we do, then you may call here for any discussion.  Please avoid intercourse until results are complete.

## 2023-09-05 ENCOUNTER — Telehealth (HOSPITAL_COMMUNITY): Payer: Self-pay

## 2023-09-05 LAB — CERVICOVAGINAL ANCILLARY ONLY
Bacterial Vaginitis (gardnerella): POSITIVE — AB
Candida Glabrata: NEGATIVE
Candida Vaginitis: NEGATIVE
Chlamydia: NEGATIVE
Comment: NEGATIVE
Comment: NEGATIVE
Comment: NEGATIVE
Comment: NEGATIVE
Comment: NEGATIVE
Comment: NORMAL
Neisseria Gonorrhea: NEGATIVE
Trichomonas: NEGATIVE

## 2023-09-05 MED ORDER — METRONIDAZOLE 500 MG PO TABS: 500.0000 mg | ORAL_TABLET | Freq: Two times a day (BID) | ORAL | 0 refills | Status: DC

## 2023-09-05 MED ORDER — METRONIDAZOLE 0.75 % VA GEL: 1.0000 | Freq: Every day | VAGINAL | 0 refills | Status: AC

## 2023-09-05 NOTE — Telephone Encounter (Signed)
 Per protocol, pt requires tx with metronidazole. Rx sent to pharmacy on file.

## 2023-09-05 NOTE — Addendum Note (Signed)
 Addended by: Warren Danes on: 09/05/2023 01:42 PM   Modules accepted: Orders

## 2023-09-05 NOTE — Telephone Encounter (Signed)
 Pt called requesting gel instead of PO. Rx updated.

## 2023-09-10 DIAGNOSIS — Z419 Encounter for procedure for purposes other than remedying health state, unspecified: Secondary | ICD-10-CM | POA: Diagnosis not present

## 2023-09-21 ENCOUNTER — Ambulatory Visit: Admitting: Obstetrics and Gynecology

## 2023-09-21 NOTE — Progress Notes (Deleted)
    GYNECOLOGY VISIT  Patient name: Rachael Cook MRN [id number]  Date of birth: 2000-11-12 Chief Complaint:   No chief complaint on file.   History:  Rachael Cook is a 23 y.o. G2P0010 being seen today for ***.    Past Medical History:  Diagnosis Date   Chlamydia infection 04/09/2018   Gonorrhea in female 02/01/2019   Treated on 02-02-19     IUD (intrauterine device) in place 09/25/2018   Kyleena  placed 04/2018     Sickle cell trait [facility])     Past Surgical History:  Procedure Laterality Date   ORIF ANKLE FRACTURE Right 01/30/2020   Procedure: OPEN REDUCTION INTERNAL FIXATION (ORIF) RIGHT ANKLE FRACTURE;  Surgeon: Timothy Ford, MD;  Location: [facility] OR;  Service: Orthopedics;  Laterality: Right;   WISDOM TOOTH EXTRACTION      The following portions of the patient's history were reviewed and updated as appropriate: allergies, current medications, past family history, past medical history, past social history, past surgical history and problem list.   Health Maintenance:   Last pap ***. Results were: {Pap findings:25134}. H/O abnormal pap: {yes/yes***/no:23866} Last mammogram: ***. Results were: {normal, abnormal, n/a:23837}. Family h/o breast cancer: {yes***/no:23838}   Review of Systems:  {Ros - complete:30496} Comprehensive review of systems was otherwise negative.   Objective:  Physical Exam LMP 08/27/2023 (Exact Date)    Physical Exam   Labs and Imaging No results found.     Assessment & Plan:   There are no diagnoses linked to this encounter.   *** Routine preventative health maintenance measures emphasized.  Kiki Pelton, MD [facility], [facility]

## 2023-10-10 DIAGNOSIS — Z419 Encounter for procedure for purposes other than remedying health state, unspecified: Secondary | ICD-10-CM | POA: Diagnosis not present

## 2023-11-10 DIAGNOSIS — Z419 Encounter for procedure for purposes other than remedying health state, unspecified: Secondary | ICD-10-CM | POA: Diagnosis not present

## 2023-12-10 DIAGNOSIS — Z419 Encounter for procedure for purposes other than remedying health state, unspecified: Secondary | ICD-10-CM | POA: Diagnosis not present

## 2023-12-13 ENCOUNTER — Ambulatory Visit
Admission: RE | Admit: 2023-12-13 | Discharge: 2023-12-13 | Disposition: A | Discharge status: Home / Self Care | Source: Ambulatory Visit | Attending: Family Medicine | Admitting: Family Medicine

## 2023-12-13 VITALS — BP 99/67 | HR 101 | Temp 98.3°F | Resp 18 | Ht 64.0 in | Wt 145.0 lb

## 2023-12-13 DIAGNOSIS — L03113 Cellulitis of right upper limb: Secondary | ICD-10-CM

## 2023-12-13 MED ORDER — CEPHALEXIN 500 MG PO CAPS: 500.0000 mg | ORAL_CAPSULE | Freq: Two times a day (BID) | ORAL | 0 refills | Status: AC

## 2023-12-13 MED ORDER — PREDNISONE 20 MG PO TABS: 40.0000 mg | ORAL_TABLET | Freq: Every day | ORAL | 0 refills | Status: AC

## 2023-12-13 NOTE — ED Triage Notes (Signed)
 I have an insect bite on my right elbow, hard, red, painful. First noticed about 3 days ago. Some swelling. No fever.

## 2023-12-13 NOTE — Discharge Instructions (Signed)
 Cephalexin  500 mg --1 tablet by mouth 2 times daily for 7 days.  Take prednisone  20 mg--2 daily for 3 days

## 2023-12-13 NOTE — ED Provider Notes (Signed)
 [name]    CSN: [id number] Arrival date & time: 12/13/23  1200      History   Chief Complaint Chief Complaint  Patient presents with   Insect Bite    Entered by patient    HPI Rachael Cook is a 23 y.o. female.   HPI Here for pain and swelling and itching on the skin of her right forearm near the elbow.  Here has been bothering her about 3 days.  No fever or chills No trouble breathing  NKDA  Last menstrual cycle June 28 Past Medical History:  Diagnosis Date   Chlamydia infection 04/09/2018   Gonorrhea in female 02/01/2019   Treated on 02-02-19     IUD (intrauterine device) in place 09/25/2018   Kyleena  placed 04/2018, Removed.   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   Closed displaced fracture of medial malleolus of right tibia     Past Surgical History:  Procedure Laterality Date   ORIF ANKLE FRACTURE Right 01/30/2020   Procedure: OPEN REDUCTION INTERNAL FIXATION (ORIF) RIGHT ANKLE FRACTURE;  Surgeon: Harden Jerona GAILS, MD;  Location: [facility] OR;  Service: Orthopedics;  Laterality: Right;   WISDOM TOOTH EXTRACTION      OB History     Gravida  2   Para  0   Term  0   Preterm  0   AB  1   Living  0      SAB  1   IAB  0   Ectopic  0   Multiple  0   Live Births  0            Home Medications    Prior to Admission medications   Medication Sig Start Date End Date Taking? Authorizing Provider  cephALEXin  (KEFLEX ) 500 MG capsule Take 1 capsule (500 mg total) by mouth 2 (two) times daily for 7 days. 12/13/23 12/20/23 Yes Vonna Sharlet POUR, MD  predniSONE  (DELTASONE ) 20 MG tablet Take 2 tablets (40 mg total) by mouth daily with breakfast for 3 days. 12/13/23 12/16/23 Yes [place], Sharlet POUR, MD    Family History Family History  Problem Relation Age of Onset   Cancer - Colon  Maternal Grandmother     Social History Social History   Tobacco Use   Smoking status: Never    Passive exposure: Never   Smokeless tobacco: Never  Vaping Use   Vaping status: Never Used  Substance Use Topics   Alcohol use: No   Drug use: Never     Allergies   Patient has no known allergies.   Review of Systems Review of Systems   Physical Exam Triage Vital Signs ED Triage Vitals  Encounter Vitals Group     BP 12/13/23 1211 99/67     Girls Systolic BP Percentile --      Girls Diastolic BP Percentile --      Boys Systolic BP Percentile --      Boys Diastolic BP Percentile --      Pulse Rate 12/13/23 1211 (!) 101     Resp 12/13/23 1211 18     Temp 12/13/23 1211 98.3 F (36.8 C)     Temp Source 12/13/23 1211 Oral     SpO2 12/13/23 1211 98 %     Weight 12/13/23 1208 145 lb (65.8 kg)  Height 12/13/23 1208 5' 4 (1.626 m)     Head Circumference --      Peak Flow --      Pain Score 12/13/23 1206 6     Pain Loc --      Pain Education --      Exclude from Growth Chart --    No data found.  Updated Vital Signs BP 99/67 (BP Location: Right Arm)   Pulse (!) 101   Temp 98.3 F (36.8 C) (Oral)   Resp 18   Ht 5' 4 (1.626 m)   Wt 65.8 kg   LMP 11/26/2023 (Exact Date)   SpO2 98%   BMI 24.89 kg/m   Visual Acuity Right Eye Distance:   Left Eye Distance:   Bilateral Distance:    Right Eye Near:   Left Eye Near:    Bilateral Near:     Physical Exam Vitals reviewed.  Constitutional:      General: She is not in acute distress.    Appearance: She is not ill-appearing, toxic-appearing or diaphoretic.  Cardiovascular:     Rate and Rhythm: Normal rate and regular rhythm.     Heart sounds: No murmur heard. Pulmonary:     Effort: Pulmonary effort is normal.     Breath sounds: Normal breath sounds.  Skin:    Coloration: Skin is not jaundiced or pale.     Comments: There is an area of erythema and induration about 2 cm in diameter.  There is a little  punctate area in the center that could be a little pustule.  It is mildly tender.  Neurological:     Mental Status: She is alert and oriented to person, place, and time.  Psychiatric:        Behavior: Behavior normal.      UC Treatments / Results  Labs (all labs ordered are listed, but only abnormal results are displayed) Labs Reviewed - No data to display  EKG   Radiology No results found.  Procedures Procedures (including critical care time)  Medications Ordered in UC Medications - No data to display  Initial Impression / Assessment and Plan / UC Course  I have reviewed the triage vital signs and the nursing notes.  Pertinent labs & imaging results that were available during my care of the patient were reviewed by me and considered in my medical decision making (see chart for details).     Keflex  is sent in for possible cellulitis and 3 days of prednisone  are sent in for reaction to insect bite. Final Clinical Impressions(s) / UC Diagnoses   Final diagnoses:  Cellulitis of right forearm     Discharge Instructions      Cephalexin  500 mg --1 tablet by mouth 2 times daily for 7 days.  Take prednisone  20 mg--2 daily for 3 days        ED Prescriptions     Medication Sig Dispense Auth. Provider   cephALEXin  (KEFLEX ) 500 MG capsule Take 1 capsule (500 mg total) by mouth 2 (two) times daily for 7 days. 14 capsule Chey Rachels K, MD   predniSONE  (DELTASONE ) 20 MG tablet Take 2 tablets (40 mg total) by mouth daily with breakfast for 3 days. 6 tablet Vonna Arrington Bencomo K, MD      PDMP not reviewed this encounter.   Vonna Sharlet POUR, MD 12/13/23 [phone]

## 2023-12-20 ENCOUNTER — Encounter: Payer: Self-pay | Admitting: Emergency Medicine

## 2023-12-20 ENCOUNTER — Ambulatory Visit
Admission: EM | Admit: 2023-12-20 | Discharge: 2023-12-20 | Disposition: A | Discharge status: Home / Self Care | Attending: Emergency Medicine | Admitting: Emergency Medicine

## 2023-12-20 DIAGNOSIS — Z3202 Encounter for pregnancy test, result negative: Secondary | ICD-10-CM | POA: Diagnosis not present

## 2023-12-20 LAB — POCT URINE PREGNANCY: Preg Test, Ur: NEGATIVE

## 2023-12-20 NOTE — ED Triage Notes (Signed)
 Pt presents requesting pregnancy test. Pt says her cycle is late and her boobs are sore. She states, I had miscarriage previously and I want to stay on top of things.

## 2023-12-20 NOTE — ED Provider Notes (Signed)
 [name]    CSN: [id number] Arrival date & time: 12/20/23  1546     History   Chief Complaint Chief Complaint  Patient presents with   Possible Pregnancy    HPI Rachael Cook is a 23 y.o. female.  Here for pregnancy test She reports her cycle is 1 week late LMP 6/15 Also having breast tenderness No abdominal pain, spotting, nausea/vomiting   She took a home test today that was positive   Past Medical History:  Diagnosis Date   Chlamydia infection 04/09/2018   Gonorrhea in female 02/01/2019   Treated on 02-02-19     IUD (intrauterine device) in place 09/25/2018   Kyleena  placed 04/2018, Removed.   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   Closed displaced fracture of medial malleolus of right tibia     Past Surgical History:  Procedure Laterality Date   ORIF ANKLE FRACTURE Right 01/30/2020   Procedure: OPEN REDUCTION INTERNAL FIXATION (ORIF) RIGHT ANKLE FRACTURE;  Surgeon: Harden Jerona GAILS, MD;  Location: [facility] OR;  Service: Orthopedics;  Laterality: Right;   WISDOM TOOTH EXTRACTION      OB History     Gravida  2   Para  0   Term  0   Preterm  0   AB  1   Living  0      SAB  1   IAB  0   Ectopic  0   Multiple  0   Live Births  0            Home Medications    Prior to Admission medications   Medication Sig Start Date End Date Taking? Authorizing Provider  cephALEXin  (KEFLEX ) 500 MG capsule Take 1 capsule (500 mg total) by mouth 2 (two) times daily for 7 days. 12/13/23 12/20/23  Vonna Sharlet POUR, MD    Family History Family History  Problem Relation Age of Onset   Cancer - Colon Maternal Grandmother     Social History Social History   Tobacco Use   Smoking status: Never    Passive exposure: Never   Smokeless tobacco: Never  Vaping Use   Vaping status: Never Used   Substance Use Topics   Alcohol use: No   Drug use: Never     Allergies   Patient has no known allergies.   Review of Systems Review of Systems As per HPI  Physical Exam Triage Vital Signs ED Triage Vitals  Encounter Vitals Group     BP 12/20/23 1604 107/74     Girls Systolic BP Percentile --      Girls Diastolic BP Percentile --      Boys Systolic BP Percentile --      Boys Diastolic BP Percentile --      Pulse Rate 12/20/23 1604 (!) 113     Resp 12/20/23 1604 18     Temp 12/20/23 1604 99 F (37.2 C)     Temp Source 12/20/23 1604 Oral     SpO2 12/20/23 1604 98 %     Weight 12/20/23 1604 145 lb 1 oz (65.8 kg)     Height --      Head Circumference --      Peak Flow --      Pain Score 12/20/23 1603 0     Pain Loc --  Pain Education --      Exclude from Growth Chart --    No data found.  Updated Vital Signs BP 107/74 (BP Location: Left Arm)   Pulse 100   Temp 99 F (37.2 C) (Oral)   Resp 18   Wt 145 lb 1 oz (65.8 kg)   LMP 11/13/2023 (Approximate)   SpO2 98%   BMI 24.90 kg/m   Physical Exam Vitals and nursing note reviewed.  Constitutional:      General: She is not in acute distress.    Appearance: Normal appearance.  HENT:     Mouth/Throat:     Pharynx: Oropharynx is clear.  Cardiovascular:     Rate and Rhythm: Normal rate and regular rhythm.     Pulses: Normal pulses.     Heart sounds: Normal heart sounds.  Pulmonary:     Effort: Pulmonary effort is normal.     Breath sounds: Normal breath sounds.  Abdominal:     General: There is no distension.     Palpations: Abdomen is soft.     Tenderness: There is no abdominal tenderness.  Neurological:     Mental Status: She is alert and oriented to person, place, and time.      UC Treatments / Results  Labs (all labs ordered are listed, but only abnormal results are displayed) Labs Reviewed  POCT URINE PREGNANCY - Normal    EKG  Radiology No results found.  Procedures Procedures    Medications Ordered in UC Medications - No data to display  Initial Impression / Assessment and Plan / UC Course  I have reviewed the triage vital signs and the nursing notes.  Pertinent labs & imaging results that were available during my care of the patient were reviewed by me and considered in my medical decision making (see chart for details).  Negative urine pregnancy test  Advised monitor for cycle onset over next several days  Option to take another home test in a few days to confirm negative Call ob/gyn to make follow up appointment  Final Clinical Impressions(s) / UC Diagnoses   Final diagnoses:  Negative pregnancy test     Discharge Instructions      Negative pregnancy test in the clinic today Please call your ob/gyn to make an appointment for follow up!     ED Prescriptions   None    PDMP not reviewed this encounter.   Jeryl Stabs, PA-C 12/20/23 1625

## 2023-12-20 NOTE — Discharge Instructions (Signed)
 Negative pregnancy test in the clinic today Please call your ob/gyn to make an appointment for follow up!

## 2024-01-03 ENCOUNTER — Ambulatory Visit (HOSPITAL_COMMUNITY)
Admission: EM | Admit: 2024-01-03 | Discharge: 2024-01-03 | Disposition: A | Discharge status: Home / Self Care | Attending: Physician Assistant | Admitting: Physician Assistant

## 2024-01-03 ENCOUNTER — Encounter (HOSPITAL_COMMUNITY): Payer: Self-pay

## 2024-01-03 DIAGNOSIS — Z113 Encounter for screening for infections with a predominantly sexual mode of transmission: Secondary | ICD-10-CM | POA: Diagnosis not present

## 2024-01-03 DIAGNOSIS — N3001 Acute cystitis with hematuria: Secondary | ICD-10-CM | POA: Diagnosis not present

## 2024-01-03 DIAGNOSIS — R1030 Lower abdominal pain, unspecified: Secondary | ICD-10-CM | POA: Insufficient documentation

## 2024-01-03 LAB — POCT URINALYSIS DIP (MANUAL ENTRY)
Bilirubin, UA: NEGATIVE
Glucose, UA: NEGATIVE mg/dL
Ketones, POC UA: NEGATIVE mg/dL
Nitrite, UA: NEGATIVE
Protein Ur, POC: 30 mg/dL — AB
Spec Grav, UA: 1.015 (ref 1.010–1.025)
Urobilinogen, UA: 0.2 U/dL
pH, UA: 6 (ref 5.0–8.0)

## 2024-01-03 LAB — POCT URINE PREGNANCY: Preg Test, Ur: NEGATIVE

## 2024-01-03 MED ORDER — NITROFURANTOIN MONOHYD MACRO 100 MG PO CAPS
100.0000 mg | ORAL_CAPSULE | Freq: Two times a day (BID) | ORAL | 0 refills | Status: DC
Start: 2024-01-03 — End: 2024-02-08

## 2024-01-03 NOTE — ED Provider Notes (Signed)
 [facility]    CSN: [id number] Arrival date & time: 01/03/24  1626      History   Chief Complaint Chief Complaint  Patient presents with   STD testing    HPI Rachael Cook is a 23 y.o. female.   Patient presents today with a weeklong history of lower abdominal cramping and discomfort.  She has had urinary frequency, urgency, mild dysuria with sensation that she is not completely emptying her bladder.  She denies any fever, nausea, vomiting.  She has not noticed any significant vaginal discharge or pain but did have postcoital bleeding following intercourse earlier this week and therefore she wanted to be evaluated today and she is concerned that something going going on.  She does report some musty odor and has had bacterial vaginosis in the past but reports that generally with this she has discharge with this condition and has not had them recently.  Denies any recent antibiotics.  Denies history of nephrolithiasis, single kidney, self-catheterization.    Past Medical History:  Diagnosis Date   Chlamydia infection 04/09/2018   Gonorrhea in female 02/01/2019   Treated on 02-02-19     IUD (intrauterine device) in place 09/25/2018   Kyleena  placed 04/2018, Removed.   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   Closed displaced fracture of medial malleolus of right tibia     Past Surgical History:  Procedure Laterality Date   ORIF ANKLE FRACTURE Right 01/30/2020   Procedure: OPEN REDUCTION INTERNAL FIXATION (ORIF) RIGHT ANKLE FRACTURE;  Surgeon: Harden Jerona GAILS, MD;  Location: [facility] OR;  Service: Orthopedics;  Laterality: Right;   WISDOM TOOTH EXTRACTION      OB History     Gravida  2   Para  0   Term  0   Preterm  0   AB  1   Living  0      SAB  1   IAB  0   Ectopic  0   Multiple  0   Live Births  0             Home Medications    Prior to Admission medications   Medication Sig Start Date End Date Taking? Authorizing Provider  nitrofurantoin , macrocrystal-monohydrate, (MACROBID ) 100 MG capsule Take 1 capsule (100 mg total) by mouth 2 (two) times daily. 01/03/24  Yes Chalise Pe, Rocky POUR, PA-C    Family History Family History  Problem Relation Age of Onset   Heart failure Father    Cancer - Colon Maternal Grandmother     Social History Social History   Tobacco Use   Smoking status: Never    Passive exposure: Never   Smokeless tobacco: Never  Vaping Use   Vaping status: Never Used  Substance Use Topics   Alcohol use: No   Drug use: Never     Allergies   Patient has no known allergies.   Review of Systems Review of Systems  Constitutional:  Positive for activity change. Negative for appetite change, fatigue and fever.  Gastrointestinal:  Positive for abdominal pain. Negative for diarrhea, nausea and vomiting.  Genitourinary:  Positive for dysuria, urgency and vaginal bleeding (Postcoital). Negative for flank pain, frequency, genital sores, hematuria, pelvic pain, vaginal discharge and vaginal pain.     Physical Exam Triage Vital Signs ED Triage Vitals [01/03/24 1748]  Encounter Vitals  Group     BP 103/71     Girls Systolic BP Percentile      Girls Diastolic BP Percentile      Boys Systolic BP Percentile      Boys Diastolic BP Percentile      Pulse Rate 76     Resp 14     Temp 98.1 F (36.7 C)     Temp Source Oral     SpO2 99 %     Weight      Height      Head Circumference      Peak Flow      Pain Score 0     Pain Loc      Pain Education      Exclude from Growth Chart    No data found.  Updated Vital Signs BP 103/71 (BP Location: Left Arm)   Pulse 76   Temp 98.1 F (36.7 C) (Oral)   Resp 14   LMP 12/22/2023 (Exact Date)   SpO2 99%   Visual Acuity Right Eye Distance:   Left Eye Distance:   Bilateral Distance:    Right Eye Near:   Left Eye  Near:    Bilateral Near:     Physical Exam Vitals reviewed.  Constitutional:      General: She is awake. She is not in acute distress.    Appearance: Normal appearance. She is well-developed. She is not ill-appearing.     Comments: Very pleasant female appears stated age in no acute distress sitting comfortably in exam room  HENT:     Head: Normocephalic and atraumatic.  Cardiovascular:     Rate and Rhythm: Normal rate and regular rhythm.     Heart sounds: Normal heart sounds, S1 normal and S2 normal. No murmur heard. Pulmonary:     Effort: Pulmonary effort is normal.     Breath sounds: Normal breath sounds. No wheezing, rhonchi or rales.     Comments: Clear to auscultation bilaterally Abdominal:     General: Bowel sounds are normal.     Palpations: Abdomen is soft.     Tenderness: There is abdominal tenderness in the suprapubic area. There is no right CVA tenderness, left CVA tenderness, guarding or rebound.     Comments: Mild tenderness palpation in lower abdomen.  No evidence of acute abdomen on physical exam.  Psychiatric:        Behavior: Behavior is cooperative.      UC Treatments / Results  Labs (all labs ordered are listed, but only abnormal results are displayed) Labs Reviewed  POCT URINALYSIS DIP (MANUAL ENTRY) - Abnormal; Notable for the following components:      Result Value   Clarity, UA cloudy (*)    Blood, UA small (*)    Protein Ur, POC =30 (*)    Leukocytes, UA Large (3+) (*)    All other components within normal limits  URINE CULTURE  POCT URINE PREGNANCY  CERVICOVAGINAL ANCILLARY ONLY    EKG   Radiology No results found.  Procedures Procedures (including critical care time)  Medications Ordered in UC Medications - No data to display  Initial Impression / Assessment and Plan / UC Course  I have reviewed the triage vital signs and the nursing notes.  Pertinent labs & imaging results that were available during my care of the patient were  reviewed by me and considered in my medical decision making (see chart for details).     Patient is well-appearing, afebrile, nontoxic,  nontachycardic.  Vital signs of physical exam are reassuring with indication for emergent evaluation or imaging.  UA was consistent with UTI so we will start Macrobid  twice daily for 5 days.  Will send this for culture and contact her if we need to discontinue or change her antibiotics based on culture results.  Notification for dose adjustment based on metabolic panel from 09/06/2022 with creatinine of 0.75 and calculated creatinine clearance of 121 mL/min.  STI swab was collected and is pending.  We will contact her if we need to arrange additional treatment based on the swab results.  We discussed that if her symptoms are worsening anyway and she develops pelvic pain, abdominal pain, fever, nausea, vomiting she needs to be seen immediately.  Strict return precautions given.  Final Clinical Impressions(s) / UC Diagnoses   Final diagnoses:  Lower abdominal pain  Acute cystitis with hematuria  Screening examination for STI     Discharge Instructions      We are treating you for urinary tract infection.  Start Macrobid  twice daily for 5 days.  Make sure that you drink plenty of water.  We will contact you if any of your other testing is abnormal and we need to change your treatment plan.  Monitor your MyChart for results.  We will only contact you if something is positive.  If you develop any severe abdominal pain, fever, nausea, vomiting, pelvic pain you need to be seen immediately.     ED Prescriptions     Medication Sig Dispense Auth. Provider   nitrofurantoin , macrocrystal-monohydrate, (MACROBID ) 100 MG capsule Take 1 capsule (100 mg total) by mouth 2 (two) times daily. 10 capsule Macguire Holsinger K, PA-C      PDMP not reviewed this encounter.   Sherrell Rocky POUR, PA-C 01/03/24 2113

## 2024-01-03 NOTE — ED Triage Notes (Addendum)
 Patient reports that she is having intermittent lower abdominal cramping, musty odor, and pressure when urinating x 1 week.  Patient added that she is concerned about -when she has sex with her partner she has vaginal bleeding.

## 2024-01-03 NOTE — Discharge Instructions (Signed)
 We are treating you for urinary tract infection.  Start Macrobid  twice daily for 5 days.  Make sure that you drink plenty of water.  We will contact you if any of your other testing is abnormal and we need to change your treatment plan.  Monitor your MyChart for results.  We will only contact you if something is positive.  If you develop any severe abdominal pain, fever, nausea, vomiting, pelvic pain you need to be seen immediately.

## 2024-01-04 ENCOUNTER — Ambulatory Visit (HOSPITAL_COMMUNITY): Payer: Self-pay

## 2024-01-04 LAB — CERVICOVAGINAL ANCILLARY ONLY
Bacterial Vaginitis (gardnerella): POSITIVE — AB
Candida Glabrata: NEGATIVE
Candida Vaginitis: NEGATIVE
Chlamydia: POSITIVE — AB
Comment: NEGATIVE
Comment: NEGATIVE
Comment: NEGATIVE
Comment: NEGATIVE
Comment: NEGATIVE
Comment: NORMAL
Neisseria Gonorrhea: NEGATIVE
Trichomonas: NEGATIVE

## 2024-01-04 LAB — URINE CULTURE

## 2024-01-04 MED ORDER — DOXYCYCLINE HYCLATE 100 MG PO TABS: 100.0000 mg | ORAL_TABLET | Freq: Two times a day (BID) | ORAL | 0 refills | Status: AC

## 2024-01-04 MED ORDER — METRONIDAZOLE 0.75 % VA GEL: 1.0000 | Freq: Every day | VAGINAL | 0 refills | Status: AC

## 2024-01-10 DIAGNOSIS — Z419 Encounter for procedure for purposes other than remedying health state, unspecified: Secondary | ICD-10-CM | POA: Diagnosis not present

## 2024-02-08 ENCOUNTER — Ambulatory Visit
Admission: RE | Admit: 2024-02-08 | Discharge: 2024-02-08 | Disposition: A | Discharge status: Home / Self Care | Source: Ambulatory Visit

## 2024-02-08 VITALS — BP 99/61 | HR 96 | Temp 98.7°F | Resp 16 | Wt 145.1 lb

## 2024-02-08 DIAGNOSIS — J029 Acute pharyngitis, unspecified: Secondary | ICD-10-CM | POA: Diagnosis not present

## 2024-02-08 DIAGNOSIS — R300 Dysuria: Secondary | ICD-10-CM | POA: Diagnosis not present

## 2024-02-08 DIAGNOSIS — Z202 Contact with and (suspected) exposure to infections with a predominantly sexual mode of transmission: Secondary | ICD-10-CM | POA: Insufficient documentation

## 2024-02-08 DIAGNOSIS — Z113 Encounter for screening for infections with a predominantly sexual mode of transmission: Secondary | ICD-10-CM | POA: Insufficient documentation

## 2024-02-08 LAB — POCT URINE DIPSTICK
Bilirubin, UA: NEGATIVE
Blood, UA: NEGATIVE
Glucose, UA: NEGATIVE mg/dL
Ketones, POC UA: NEGATIVE mg/dL
Nitrite, UA: NEGATIVE
POC PROTEIN,UA: NEGATIVE
Spec Grav, UA: 1.02 (ref 1.010–1.025)
Urobilinogen, UA: 0.2 U/dL
pH, UA: 7 (ref 5.0–8.0)

## 2024-02-08 LAB — POCT RAPID STREP A (OFFICE): Rapid Strep A Screen: NEGATIVE

## 2024-02-08 LAB — POCT URINE PREGNANCY: Preg Test, Ur: NEGATIVE

## 2024-02-08 MED ORDER — DOXYCYCLINE HYCLATE 100 MG PO CAPS: 100.0000 mg | ORAL_CAPSULE | Freq: Two times a day (BID) | ORAL | 0 refills | Status: DC

## 2024-02-08 NOTE — ED Provider Notes (Signed)
 [name]    CSN: [id number] Arrival date & time: 02/08/24  1744      History   Chief Complaint Chief Complaint  Patient presents with   Exposure to STD    Entered by patient    HPI Rachael Cook is a 23 y.o. female.   Discussed the use of AI scribe software for clinical note transcription with the patient, who gave verbal consent to proceed.   The patient presents following known exposure to chlamydia from a sexual partner. The patient was informed of the exposure two days ago, with the last sexual encounter occurring on August 28th. The patient reports burning and itching in the genital area, as well as irritation and odor.  The patient denies any vaginal discharge, sores in the mouth or genital area. They report having one female sexual partners in the past 3 months and inconsistent condom use. The patient confirms engaging in oral sex. The patient's last menstrual period started on August 30th. They also mention having a sore throat, but deny associated symptoms such as runny nose, congestion, or fever.  The following sections of the patient's history were reviewed and updated as appropriate: allergies, current medications, past family history, past medical history, past social history, past surgical history, and problem list.       Past Medical History:  Diagnosis Date   Chlamydia infection 04/09/2018   Gonorrhea in female 02/01/2019   Treated on 02-02-19     IUD (intrauterine device) in place 09/25/2018   Kyleena  placed 04/2018, Removed.   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   Closed displaced fracture of medial malleolus of right tibia     Past Surgical History:  Procedure Laterality Date   ORIF ANKLE FRACTURE Right 01/30/2020   Procedure: OPEN REDUCTION INTERNAL FIXATION (ORIF) RIGHT ANKLE FRACTURE;   Surgeon: Harden Jerona GAILS, MD;  Location: [facility];  Service: Orthopedics;  Laterality: Right;   WISDOM TOOTH EXTRACTION      OB History     Gravida  2   Para  0   Term  0   Preterm  0   AB  1   Living  0      SAB  1   IAB  0   Ectopic  0   Multiple  0   Live Births  0            Home Medications    Prior to Admission medications   Medication Sig Start Date End Date Taking? Authorizing Provider  doxycycline  (VIBRAMYCIN ) 100 MG capsule Take 1 capsule (100 mg total) by mouth 2 (two) times daily. 02/08/24  Yes Iola Lukes, FNP    Family History Family History  Problem Relation Age of Onset   Heart failure Father    Cancer - Colon Maternal Grandmother     Social History Social History   Tobacco Use   Smoking status: Never    Passive exposure: Never   Smokeless tobacco: Never  Vaping Use   Vaping status: Never Used  Substance Use Topics   Alcohol use: No   Drug use: Never     Allergies   Patient has no known allergies.   Review of Systems Review of Systems  HENT:  Positive for sore throat. Negative for mouth sores.   Genitourinary:  Positive for dysuria. Negative for  genital sores, menstrual problem (LMP 01/28/24) and vaginal discharge.       Vaginal itching, odor and irritation   All other systems reviewed and are negative.    Physical Exam Triage Vital Signs ED Triage Vitals  Encounter Vitals Group     BP 02/08/24 1829 99/61     Girls Systolic BP Percentile --      Girls Diastolic BP Percentile --      Boys Systolic BP Percentile --      Boys Diastolic BP Percentile --      Pulse Rate 02/08/24 1829 96     Resp 02/08/24 1829 16     Temp 02/08/24 1829 98.7 F (37.1 C)     Temp Source 02/08/24 1829 Oral     SpO2 02/08/24 1829 98 %     Weight 02/08/24 1829 145 lb 1 oz (65.8 kg)     Height --      Head Circumference --      Peak Flow --      Pain Score 02/08/24 1828 6     Pain Loc --      Pain Education --      Exclude from  Growth Chart --    No data found.  Updated Vital Signs BP 99/61 (BP Location: Left Arm)   Pulse 96   Temp 98.7 F (37.1 C) (Oral)   Resp 16   Wt 145 lb 1 oz (65.8 kg)   LMP 01/28/2024 (Approximate)   SpO2 98%   BMI 24.90 kg/m   Visual Acuity Right Eye Distance:   Left Eye Distance:   Bilateral Distance:    Right Eye Near:   Left Eye Near:    Bilateral Near:     Physical Exam Constitutional:      General: She is not in acute distress.    Appearance: Normal appearance. She is not ill-appearing, toxic-appearing or diaphoretic.  HENT:     Head: Normocephalic.     Nose: Nose normal.     Mouth/Throat:     Mouth: Mucous membranes are moist.     Pharynx: Oropharynx is clear. Uvula midline.  Eyes:     Conjunctiva/sclera: Conjunctivae normal.  Cardiovascular:     Rate and Rhythm: Normal rate.  Pulmonary:     Effort: Pulmonary effort is normal.  Abdominal:     Palpations: Abdomen is soft.  Genitourinary:    Comments: Deferred; patient performed self-swab for Aptima testing  Musculoskeletal:        General: Normal range of motion.     Cervical back: Normal range of motion and neck supple.  Lymphadenopathy:     Cervical: No cervical adenopathy.  Skin:    General: Skin is warm and dry.  Neurological:     General: No focal deficit present.     Mental Status: She is alert and oriented to person, place, and time.  Psychiatric:        Mood and Affect: Mood normal.        Behavior: Behavior normal.      UC Treatments / Results  Labs (all labs ordered are listed, but only abnormal results are displayed) Labs Reviewed  POCT URINE DIPSTICK - Abnormal; Notable for the following components:      Result Value   Clarity, UA hazy (*)    Leukocytes, UA Small (1+) (*)    All other components within normal limits  POCT RAPID STREP A (OFFICE) - Normal  URINE CULTURE  POCT URINE PREGNANCY  CERVICOVAGINAL ANCILLARY ONLY  CYTOLOGY, (ORAL, ANAL, URETHRAL) ANCILLARY ONLY     EKG   Radiology No results found.  Procedures Procedures (including critical care time)  Medications Ordered in UC Medications - No data to display  Initial Impression / Assessment and Plan / UC Course  I have reviewed the triage vital signs and the nursing notes.  Pertinent labs & imaging results that were available during my care of the patient were reviewed by me and considered in my medical decision making (see chart for details).     The patient presents after known exposure to chlamydia from a sexual partner on January 26, 2024, and reports burning with urination, genital itching, irritation, odor, and a sore throat that began today. She denies vaginal discharge, oral sores, or genital sores. She reports inconsistent condom use and a history of oral sex. Last menstrual period was on January 28, 2024. Urine testing today showed no evidence of infection. Vaginal swabs were obtained for gonorrhea, chlamydia, trichomonas, bacterial vaginosis, and yeast, and an oral swab was collected to test for gonorrhea and chlamydia. Urine culture is pending. Rapid strep was negative, and she has no other upper respiratory symptoms. The patient declined HIV and syphilis blood testing. Given symptoms and reported exposure, she was treated empirically with doxycycline  100 mg orally twice daily for seven days, with instructions to take with food and avoid direct sunlight during treatment. She was advised to abstain from sexual activity until results are available and treatment is complete. Follow-up with primary care or clinic is recommended to review test results and for further management. She should seek emergency care if she develops severe abdominal or pelvic pain, fever, vomiting, or any sudden worsening of symptoms.  Today's evaluation has revealed no signs of a dangerous process. Discussed diagnosis with patient and/or guardian. Patient and/or guardian aware of their diagnosis, possible red flag  symptoms to watch out for and need for close follow up. Patient and/or guardian understands verbal and written discharge instructions. Patient and/or guardian comfortable with plan and disposition.  Patient and/or guardian has a clear mental status at this time, good insight into illness (after discussion and teaching) and has clear judgment to make decisions regarding their care  Documentation was completed with the aid of voice recognition software. Transcription may contain typographical errors.  Final Clinical Impressions(s) / UC Diagnoses   Final diagnoses:  Dysuria  Acute sore throat  Exposure to chlamydia  Screen for STD (sexually transmitted disease)     Discharge Instructions      You were seen today after being exposed to chlamydia. You are having burning with urination, genital itching, irritation, odor, and a sore throat. Your urine test today did not show signs of a bladder infection. Vaginal swabs were collected to test for gonorrhea, chlamydia, trichomonas, bacterial vaginosis, and yeast.  A rapid strep test was negative. An oral swab was also collected to test for gonorrhea and chlamydia in the throat. A urine culture was sent and results are pending. You declined blood work for HIV and syphilis at this visit.  Because of your exposure and current symptoms, you were treated today with doxycycline  to cover possible chlamydia infection. You should take this medication twice a day for seven days, always with food, and avoid prolonged sun exposure while on the medication. Do not stop the medication early even if you start to feel better. You should not have sex until you have completed the treatment and test results have returned. If results  show another infection, you may need additional medication.  At home, drink plenty of water to stay hydrated and avoid using scented soaps, sprays, or douches in the genital area, as these may worsen irritation. Over-the-counter pain relievers  such as acetaminophen  or ibuprofen  can be used if you have discomfort.  Follow up with your primary care provider or return to the clinic to review your test results and discuss further management. You should also consider follow-up testing for HIV and syphilis even though you declined this today, as these infections can occur without symptoms.  Go to the emergency department immediately if you develop severe abdominal or pelvic pain, fever, vomiting, worsening sore throat, trouble swallowing, or any sudden change such as heavy vaginal bleeding or inability to urinate.     ED Prescriptions     Medication Sig Dispense Auth. Provider   doxycycline  (VIBRAMYCIN ) 100 MG capsule Take 1 capsule (100 mg total) by mouth 2 (two) times daily. 14 capsule Iola Lukes, FNP      PDMP not reviewed this encounter.   Iola Lukes, [place] 02/08/24 1907

## 2024-02-08 NOTE — ED Triage Notes (Signed)
 Pt presents requesting STD testing. Pt reports sxs as burning when urinating, itching, and vaginal odor. Last sexual encounter 01/26/24. Pt also c/o throat pain.

## 2024-02-08 NOTE — Discharge Instructions (Addendum)
 You were seen today after being exposed to chlamydia. You are having burning with urination, genital itching, irritation, odor, and a sore throat. Your urine test today did not show signs of a bladder infection. Vaginal swabs were collected to test for gonorrhea, chlamydia, trichomonas, bacterial vaginosis, and yeast.  A rapid strep test was negative. An oral swab was also collected to test for gonorrhea and chlamydia in the throat. A urine culture was sent and results are pending. You declined blood work for HIV and syphilis at this visit.  Because of your exposure and current symptoms, you were treated today with doxycycline  to cover possible chlamydia infection. You should take this medication twice a day for seven days, always with food, and avoid prolonged sun exposure while on the medication. Do not stop the medication early even if you start to feel better. You should not have sex until you have completed the treatment and test results have returned. If results show another infection, you may need additional medication.  At home, drink plenty of water to stay hydrated and avoid using scented soaps, sprays, or douches in the genital area, as these may worsen irritation. Over-the-counter pain relievers such as acetaminophen  or ibuprofen  can be used if you have discomfort.  Follow up with your primary care provider or return to the clinic to review your test results and discuss further management. You should also consider follow-up testing for HIV and syphilis even though you declined this today, as these infections can occur without symptoms.  Go to the emergency department immediately if you develop severe abdominal or pelvic pain, fever, vomiting, worsening sore throat, trouble swallowing, or any sudden change such as heavy vaginal bleeding or inability to urinate.

## 2024-02-09 ENCOUNTER — Ambulatory Visit: Payer: Self-pay | Admitting: Nurse Practitioner

## 2024-02-09 LAB — CERVICOVAGINAL ANCILLARY ONLY
Bacterial Vaginitis (gardnerella): POSITIVE — AB
Candida Glabrata: NEGATIVE
Candida Vaginitis: NEGATIVE
Chlamydia: POSITIVE — AB
Comment: NEGATIVE
Comment: NEGATIVE
Comment: NEGATIVE
Comment: NEGATIVE
Comment: NEGATIVE
Comment: NORMAL
Neisseria Gonorrhea: NEGATIVE
Trichomonas: NEGATIVE

## 2024-02-09 LAB — URINE CULTURE: Culture: <10000 — AB

## 2024-02-09 LAB — CYTOLOGY, (ORAL, ANAL, URETHRAL) ANCILLARY ONLY
Chlamydia: NEGATIVE
Comment: NEGATIVE
Comment: NORMAL
Neisseria Gonorrhea: NEGATIVE

## 2024-02-10 DIAGNOSIS — Z419 Encounter for procedure for purposes other than remedying health state, unspecified: Secondary | ICD-10-CM | POA: Diagnosis not present

## 2024-02-10 MED ORDER — METRONIDAZOLE 0.75 % VA GEL
1.0000 | Freq: Every day | VAGINAL | 0 refills | Status: AC
Start: 2024-02-10 — End: 2024-02-15

## 2024-03-26 ENCOUNTER — Encounter (HOSPITAL_COMMUNITY): Payer: Self-pay | Admitting: *Deleted

## 2024-03-26 ENCOUNTER — Ambulatory Visit (HOSPITAL_COMMUNITY): Admission: EM | Admit: 2024-03-26 | Discharge: 2024-03-26 | Disposition: A | Discharge status: Home / Self Care

## 2024-03-26 DIAGNOSIS — S0502XA Injury of conjunctiva and corneal abrasion without foreign body, left eye, initial encounter: Secondary | ICD-10-CM

## 2024-03-26 MED ORDER — DICLOFENAC SODIUM 0.1 % OP SOLN: 1.0000 [drp] | Freq: Four times a day (QID) | OPHTHALMIC | 0 refills | Status: AC

## 2024-03-26 MED ORDER — TETRACAINE HCL 0.5 % OP SOLN
OPHTHALMIC | Status: AC
  Filled 2024-03-26: qty 4

## 2024-03-26 MED ORDER — FLUORESCEIN SODIUM 1 MG OP STRP
ORAL_STRIP | OPHTHALMIC | Status: AC
  Filled 2024-03-26: qty 1

## 2024-03-26 NOTE — Discharge Instructions (Addendum)
 FOLLOW-UP WITH OPHTHALMOLOGIST IN 3-5 DAYS IF SYMPTOMS DO NOT IMPROVE OR YOU HAVE CHANGES IN VISION

## 2024-03-26 NOTE — ED Triage Notes (Signed)
 Pt states she scratched her left eye on Saturday trying to get an eye lash out of it. She has been taking tylenol  and IBU

## 2024-03-26 NOTE — ED Provider Notes (Signed)
 [facility]    CSN: [id number] Arrival date & time: 03/26/24  1200      History   Chief Complaint Chief Complaint  Patient presents with   Eye Pain    HPI Rachael Cook is a 23 y.o. female.   Pt presents today due to 3 days worth of left eye pain, blurred vision due to tears, sensitivity to light, and excessive tearing. Pt states that she believes she scratched her eye while attempting to put in eyelashes.    Eye Pain    Past Medical History:  Diagnosis Date   Chlamydia infection 04/09/2018   Gonorrhea in female 02/01/2019   Treated on 02-02-19     IUD (intrauterine device) in place 09/25/2018   Kyleena  placed 04/2018, Removed.   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/31/2022   Closed displaced fracture of medial malleolus of right tibia     Past Surgical History:  Procedure Laterality Date   ORIF ANKLE FRACTURE Right 01/30/2020   Procedure: OPEN REDUCTION INTERNAL FIXATION (ORIF) RIGHT ANKLE FRACTURE;  Surgeon: Harden Jerona GAILS, MD;  Location: [facility] OR;  Service: Orthopedics;  Laterality: Right;   WISDOM TOOTH EXTRACTION      OB History     Gravida  2   Para  0   Term  0   Preterm  0   AB  1   Living  0      SAB  1   IAB  0   Ectopic  0   Multiple  0   Live Births  0            Home Medications    Prior to Admission medications   Medication Sig Start Date End Date Taking? Authorizing Provider  diclofenac (VOLTAREN) 0.1 % ophthalmic solution Place 1 drop into the left eye 4 (four) times daily. DO NOT USE MORE THAN 5 DAYS 03/26/24  Yes Andra Krabbe C, PA-C  doxycycline  (VIBRAMYCIN ) 100 MG capsule Take 1 capsule (100 mg total) by mouth 2 (two) times daily. 02/08/24   Iola Lukes, FNP    Family History Family History  Problem Relation Age of Onset   Heart failure Father    Cancer  - Colon Maternal Grandmother     Social History Social History   Tobacco Use   Smoking status: Never    Passive exposure: Never   Smokeless tobacco: Never  Vaping Use   Vaping status: Never Used  Substance Use Topics   Alcohol use: No   Drug use: Never     Allergies   Patient has no known allergies.   Review of Systems Review of Systems  Eyes:  Positive for pain.     Physical Exam Triage Vital Signs ED Triage Vitals  Encounter Vitals Group     BP 03/26/24 1321 103/70     Girls Systolic BP Percentile --      Girls Diastolic BP Percentile --      Boys Systolic BP Percentile --      Boys Diastolic BP Percentile --      Pulse Rate 03/26/24 1321 (!) 106     Resp 03/26/24 1321 16     Temp 03/26/24 1321 98.3 F (36.8 C)     Temp Source 03/26/24 1321 Oral     SpO2 03/26/24 1321 98 %  Weight --      Height --      Head Circumference --      Peak Flow --      Pain Score 03/26/24 1320 7     Pain Loc --      Pain Education --      Exclude from Growth Chart --    No data found.  Updated Vital Signs BP 103/70 (BP Location: Left Arm)   Pulse (!) 106   Temp 98.3 F (36.8 C) (Oral)   Resp 16   LMP 03/21/2024 (Exact Date)   SpO2 98%   Visual Acuity Right Eye Distance: 20/30 Left Eye Distance: 20/200 Bilateral Distance: 20/30  Right Eye Near:   Left Eye Near:    Bilateral Near:     Physical Exam Vitals and nursing note reviewed.  Constitutional:      General: She is not in acute distress.    Appearance: Normal appearance. She is not ill-appearing, toxic-appearing or diaphoretic.  Eyes:     General: Lids are normal. Lids are everted, no foreign bodies appreciated. Vision grossly intact. Gaze aligned appropriately. No allergic shiner, visual field deficit or scleral icterus.    Extraocular Movements: Extraocular movements intact.     Pupils: Pupils are equal, round, and reactive to light.     Left eye: Corneal abrasion and fluorescein  uptake present.   Cardiovascular:     Rate and Rhythm: Normal rate and regular rhythm.     Heart sounds: Normal heart sounds.  Pulmonary:     Effort: Pulmonary effort is normal. No respiratory distress.     Breath sounds: Normal breath sounds. No wheezing or rhonchi.  Skin:    General: Skin is warm.  Neurological:     Mental Status: She is alert and oriented to person, place, and time.  Psychiatric:        Mood and Affect: Mood normal.        Behavior: Behavior normal.      UC Treatments / Results  Labs (all labs ordered are listed, but only abnormal results are displayed) Labs Reviewed - No data to display  EKG   Radiology No results found.  Procedures Procedures (including critical care time)  Medications Ordered in UC Medications - No data to display  Initial Impression / Assessment and Plan / UC Course  I have reviewed the triage vital signs and the nursing notes.  Pertinent labs & imaging results that were available during my care of the patient were reviewed by me and considered in my medical decision making (see chart for details).     Corneal abrasion noted of left eye, patient was prescribed diclofenac 0.1% eyedrops and advised to follow-up with eye doctor if symptoms do not improve in 3 to 5 days or if her vision worsens.  Patient also given instructions to protect her eyes from the sun. Final Clinical Impressions(s) / UC Diagnoses   Final diagnoses:  Abrasion of left cornea, initial encounter     Discharge Instructions      FOLLOW-UP WITH OPHTHALMOLOGIST IN 3-5 DAYS IF SYMPTOMS DO NOT IMPROVE OR YOU HAVE CHANGES IN VISION    ED Prescriptions     Medication Sig Dispense Auth. Provider   diclofenac (VOLTAREN) 0.1 % ophthalmic solution Place 1 drop into the left eye 4 (four) times daily. DO NOT USE MORE THAN 5 DAYS 5 mL Andra Corean BROCKS, PA-C      PDMP not reviewed this encounter.   Andra Corean BROCKS,  PA-C 03/26/24 1601

## 2024-05-07 ENCOUNTER — Other Ambulatory Visit: Payer: Self-pay

## 2024-05-07 ENCOUNTER — Other Ambulatory Visit (HOSPITAL_COMMUNITY)
Admission: RE | Admit: 2024-05-07 | Discharge: 2024-05-07 | Disposition: A | Discharge status: Home / Self Care | Payer: Self-pay | Source: Ambulatory Visit | Attending: Student | Admitting: Student

## 2024-05-07 ENCOUNTER — Encounter: Payer: Self-pay | Admitting: Student

## 2024-05-07 ENCOUNTER — Ambulatory Visit (INDEPENDENT_AMBULATORY_CARE_PROVIDER_SITE_OTHER): Payer: Self-pay | Admitting: Student

## 2024-05-07 VITALS — BP 109/72 | HR 103 | Ht 61.0 in | Wt 142.7 lb

## 2024-05-07 DIAGNOSIS — L0292 Furuncle, unspecified: Secondary | ICD-10-CM | POA: Diagnosis not present

## 2024-05-07 DIAGNOSIS — Z113 Encounter for screening for infections with a predominantly sexual mode of transmission: Secondary | ICD-10-CM

## 2024-05-07 DIAGNOSIS — Z01419 Encounter for gynecological examination (general) (routine) without abnormal findings: Secondary | ICD-10-CM

## 2024-05-07 DIAGNOSIS — Z01411 Encounter for gynecological examination (general) (routine) with abnormal findings: Secondary | ICD-10-CM

## 2024-05-07 DIAGNOSIS — Z319 Encounter for procreative management, unspecified: Secondary | ICD-10-CM

## 2024-05-07 DIAGNOSIS — Z124 Encounter for screening for malignant neoplasm of cervix: Secondary | ICD-10-CM

## 2024-05-07 MED ORDER — DOXYCYCLINE HYCLATE 100 MG PO CAPS: 100.0000 mg | ORAL_CAPSULE | Freq: Two times a day (BID) | ORAL | 0 refills | Status: AC

## 2024-05-07 NOTE — Progress Notes (Unsigned)
  History:  Ms. Anjalee Cope is a 23 y.o. G2P0010 who presents to clinic today for pap smear . She also shares desire for pregnancy and concern r/t recurring vaginal boil. Patient has concerns given her h/o of extopic pregnancy and the risks associated with getting pregnant again. She also shares that she has had a boil on and off in the vaginal area. It has swollen up in the past and become very painful. She was prescribed an antibiotic, but never finished the prescribed dose because things would start to improve so she would stop taking the pills.  The following portions of the patient's history were reviewed and updated as appropriate: allergies, current medications, family history, past medical history, social history, past surgical history and problem list.  Review of Systems:  Review of Systems  Constitutional:  Negative for chills and fever.  Respiratory: Negative.    Genitourinary:  Negative for dysuria, frequency and urgency.       Boil on vagina  Neurological: Negative.   Psychiatric/Behavioral: Negative.        Objective:  Physical Exam BP 109/72   Pulse (!) 103   Ht 5' 1 (1.549 m)   Wt 142 lb 11.2 oz (64.7 kg)   LMP 04/19/2024 (Exact Date)   BMI 26.96 kg/m  Physical Exam Exam conducted with a chaperone present.  Constitutional:      Appearance: Normal appearance. She is normal weight.  Genitourinary:    General: Normal vulva.     Exam position: Lithotomy position.     Labia:        Right: No rash, tenderness or lesion.        Left: No rash, tenderness or lesion.      Urethra: No urethral pain.     Vagina: Normal.     Cervix: Friability present. No lesion.      Comments: 1.5 cm superficial nodule, nontender, mobile, without noticeable skin changes Neurological:     Mental Status: She is alert.       Labs and Imaging No results found for this or any previous visit (from the past 24 hours).  No results found.   Assessment & Plan:  1. Women's  annual routine gynecological examination (Primary) - Addressed questions and concerns  2. Cervical cancer screening - Cytology - PAP( [place])  3. Screening for STD (sexually transmitted disease) - Added to Pap smear, declined blood work - Cytology - PAP( [place])  4. Recurrent furunculosis - Discussed importance of completing antibiotic as prescribed - Recommend avoiding shaving or other hair removal - Encouraged to follow-up in 2-3 weeks if not improved or is getting worse after pharmacological therapy - continue warm compression - doxycycline  (VIBRAMYCIN ) 100 MG capsule; Take 1 capsule (100 mg total) by mouth 2 (two) times daily for 14 days.  Dispense: 28 capsule; Refill: 0  5. Desire for pregnancy - reassurance provided  - discussed that increased risk for repeat ectopic pregnancy, but not significant enough to recommend a change in management - discussed health promotion such as rest, exercise, prenatal vitamin, avoid recreational drug use and precautions with certain prescribed medications. ( For example, discussed mixed recommendations for use of doxycycline  in pregnancy and importance of completing treatment before pursuing pregnancy )    Approximately 20 minutes of face-to-face time was spent with this patient   Melvenia Favela, NP 05/07/2024 8:45 PM

## 2024-05-09 LAB — CYTOLOGY - PAP
Chlamydia: NEGATIVE
Comment: NEGATIVE
Comment: NEGATIVE
Comment: NORMAL
Neisseria Gonorrhea: NEGATIVE
Trichomonas: NEGATIVE

## 2024-05-10 ENCOUNTER — Ambulatory Visit: Payer: Self-pay | Admitting: Student
